# Patient Record
Sex: Female | Born: 1975
Health system: Southern US, Community
[De-identification: ages and names within clinical notes are randomized; demographics above are authoritative.]

## PROBLEM LIST (undated history)

## (undated) DIAGNOSIS — J45909 Unspecified asthma, uncomplicated: Secondary | ICD-10-CM

## (undated) DIAGNOSIS — D219 Benign neoplasm of connective and other soft tissue, unspecified: Secondary | ICD-10-CM

## (undated) DIAGNOSIS — O09529 Supervision of elderly multigravida, unspecified trimester: Secondary | ICD-10-CM

## (undated) DIAGNOSIS — U071 COVID-19: Secondary | ICD-10-CM

## (undated) DIAGNOSIS — D649 Anemia, unspecified: Secondary | ICD-10-CM

## (undated) HISTORY — PX: DILATION AND CURETTAGE OF UTERUS: SHX78

## (undated) HISTORY — DX: Unspecified asthma, uncomplicated: J45.909

## (undated) HISTORY — PX: HERNIA REPAIR: SHX51

---

## 2002-08-16 ENCOUNTER — Other Ambulatory Visit: Admission: RE | Admit: 2002-08-16 | Discharge: 2002-08-16 | Payer: Self-pay | Admitting: Gynecology

## 2003-08-21 ENCOUNTER — Other Ambulatory Visit: Admission: RE | Admit: 2003-08-21 | Discharge: 2003-08-21 | Payer: Self-pay | Admitting: Gynecology

## 2004-08-23 ENCOUNTER — Other Ambulatory Visit: Admission: RE | Admit: 2004-08-23 | Discharge: 2004-08-23 | Payer: Self-pay | Admitting: Gynecology

## 2005-01-24 ENCOUNTER — Ambulatory Visit: Payer: Self-pay | Admitting: Family Medicine

## 2005-03-03 ENCOUNTER — Other Ambulatory Visit: Admission: RE | Admit: 2005-03-03 | Discharge: 2005-03-03 | Payer: Self-pay | Admitting: Gynecology

## 2005-09-15 ENCOUNTER — Inpatient Hospital Stay (HOSPITAL_COMMUNITY): Admission: AD | Admit: 2005-09-15 | Discharge: 2005-09-15 | Payer: Self-pay | Admitting: Gynecology

## 2005-09-21 ENCOUNTER — Inpatient Hospital Stay (HOSPITAL_COMMUNITY): Admission: AD | Admit: 2005-09-21 | Discharge: 2005-09-23 | Payer: Self-pay | Admitting: Gynecology

## 2005-11-02 ENCOUNTER — Other Ambulatory Visit: Admission: RE | Admit: 2005-11-02 | Discharge: 2005-11-02 | Payer: Self-pay | Admitting: Gynecology

## 2007-06-01 ENCOUNTER — Other Ambulatory Visit: Admission: RE | Admit: 2007-06-01 | Discharge: 2007-06-01 | Payer: Self-pay | Admitting: Gynecology

## 2008-03-27 ENCOUNTER — Inpatient Hospital Stay (HOSPITAL_COMMUNITY): Admission: RE | Admit: 2008-03-27 | Discharge: 2008-03-29 | Payer: Self-pay | Admitting: Obstetrics and Gynecology

## 2008-10-29 LAB — CONVERTED CEMR LAB: Pap Smear: NORMAL

## 2009-09-29 ENCOUNTER — Ambulatory Visit: Payer: Self-pay | Admitting: Family Medicine

## 2009-09-30 LAB — CONVERTED CEMR LAB
Albumin: 4.2 g/dL (ref 3.5–5.2)
Alkaline Phosphatase: 49 units/L (ref 39–117)
Calcium: 9.2 mg/dL (ref 8.4–10.5)
GFR calc non Af Amer: 93.51 mL/min (ref >60)
Glucose, Bld: 79 mg/dL (ref 70–99)
HDL: 60.5 mg/dL (ref >39.00)
Sodium: 142 meq/L (ref 135–145)
Triglycerides: 35 mg/dL (ref 0.0–149.0)
VLDL: 7 mg/dL (ref 0.0–40.0)

## 2010-09-30 ENCOUNTER — Ambulatory Visit: Payer: Self-pay | Admitting: Family Medicine

## 2010-10-06 ENCOUNTER — Ambulatory Visit: Payer: Self-pay | Admitting: Family Medicine

## 2010-10-06 DIAGNOSIS — R5383 Other fatigue: Secondary | ICD-10-CM

## 2010-10-06 DIAGNOSIS — R5381 Other malaise: Secondary | ICD-10-CM | POA: Insufficient documentation

## 2010-10-06 LAB — CONVERTED CEMR LAB
Basophils Relative: 0.4 % (ref 0.0–3.0)
Chloride: 106 meq/L (ref 96–112)
Cholesterol: 164 mg/dL (ref 0–200)
Eosinophils Relative: 0.5 % (ref 0.0–5.0)
Folate: 10.3 ng/mL
LDL Cholesterol: 101 mg/dL — ABNORMAL HIGH (ref 0–99)
Lymphocytes Relative: 38.3 % (ref 12.0–46.0)
Neutrophils Relative %: 54 % (ref 43.0–77.0)
Potassium: 3.7 meq/L (ref 3.5–5.1)
RBC: 4.22 M/uL (ref 3.87–5.11)
Sodium: 139 meq/L (ref 135–145)
TSH: 2.44 microintl units/mL (ref 0.35–5.50)
Total CHOL/HDL Ratio: 3
Vitamin B-12: 274 pg/mL (ref 211–911)
WBC: 7.2 10*3/uL (ref 4.5–10.5)

## 2010-11-11 NOTE — Assessment & Plan Note (Signed)
Summary: CPX/CLE   Vital Signs:  Patient profile:   35 year old female Height:      61 inches Weight:      143.75 pounds BMI:     27.26 Temp:     98.1 degrees F oral Pulse rate:   72 / minute Pulse rhythm:   regular BP sitting:   102 / 70  (left arm) Cuff size:   regular  Vitals Entered By: Linde Gillis CMA Duncan Dull) (October 06, 2010 8:05 AM) CC: complete physicial, no pap   History of Present Illness: Very pleasant 35 yo female here for CPX, no pap, has OBGYN.  G2P2- two daughters, ages 61 and 2.  Has Mirena IUD (placed about 1 year ago).  Minimimal periods. No h/o abnormal pap smears or STDs.  Has good relationship with husband. He works in [place], she is a Futures trader. Neither of them have parents so they are a very close knit family.  Very active, has a treadmill at home. Exercises 2-3 times per week.  No issues with CP or SOB with exertion.  Has gained almost 20 pounds since last office visit.  Feels tired.  No hot or cold intolerance.  No signs of depression.  Feels like her acne is worsening.  Has regular derm appt scheduled next month.  Uses Tretinoin Cream 0.025% at bedtime.    Current Medications (verified): 1)  Mirena 20 Mcg/24hr Iud (Levonorgestrel) 2)  Ketoconazole 2 % Sham (Ketoconazole) .... Use Once A Week 3)  Tretinoin 0.025 % Crea (Tretinoin) .... Apply At Bedtime  Allergies (verified): No Known Drug Allergies  Past History:  Past Surgical History: Last updated: 09/29/2009 hernia repair as child  Family History: Last updated: 09/29/2009 Unknown  Social History: Last updated: 09/29/2009 Lives with husband and 2 daughters in [place].  Used to work in Photographer, now Therapist, music. Never Smoked Alcohol use-no Drug use-no Regular exercise-yes  Risk Factors: Exercise: yes (09/29/2009)  Risk Factors: Smoking Status: never (09/29/2009)  Review of Systems      See HPI General:  Complains of fatigue; denies fever. Eyes:  Denies  discharge. ENT:  Denies difficulty swallowing. CV:  Denies chest pain or discomfort. Resp:  Denies shortness of breath. GI:  Denies abdominal pain, change in bowel habits, and constipation. GU:  Denies abnormal vaginal bleeding and discharge. MS:  Denies joint pain, joint redness, and joint swelling. Derm:  Denies rash. Neuro:  Denies weakness. Psych:  Denies anxiety and depression. Endo:  Complains of weight change; denies excessive hunger and heat intolerance. Heme:  Denies abnormal bruising and enlarge lymph nodes.  Physical Exam  General:  Well-developed,well-nourished,in no acute distress; alert,appropriate and cooperative throughout examination Head:  normocephalic and atraumatic.   Eyes:  vision grossly intact, pupils equal, and pupils round.   Ears:  R ear normal and L ear normal.   Nose:  no external deformity.   Mouth:  good dentition.   Neck:  No deformities, masses, or tenderness noted. Lungs:  Normal respiratory effort, chest expands symmetrically. Lungs are clear to auscultation, no crackles or wheezes. Heart:  Normal rate and regular rhythm. S1 and S2 normal without gallop, murmur, click, rub or other extra sounds. Abdomen:  Bowel sounds positive,abdomen soft and non-tender without masses, organomegaly or hernias noted. Msk:  No deformity or scoliosis noted of thoracic or lumbar spine.   Extremities:  No clubbing, cyanosis, edema, or deformity noted with normal full range of motion of all joints.   Skin:  cyctic acne Psych:  Cognition and judgment appear intact. Alert and cooperative with normal attention span and concentration. No apparent delusions, illusions, hallucinations   Impression & Recommendations:  Problem # 1:  Preventive Health Care (ICD-V70.0) Reviewed preventive care protocols, scheduled due services, and updated immunizations Discussed nutrition, exercise, diet, and healthy lifestyle. FLP, BMET today. Orders: Venipuncture (78469)  Problem # 2:   FATIGUE (ICD-780.79) Assessment: New Likely multifactorial.  Will check TSH, CBC, B12, folate. Orders: TLB-TSH (Thyroid Stimulating Hormone) (84443-TSH) TLB-B12 + Folate Pnl (62952_84132-G40/NUU) TLB-CBC Platelet - w/Differential (85025-CBCD)  Complete Medication List: 1)  Mirena 20 Mcg/24hr Iud (Levonorgestrel) 2)  Ketoconazole 2 % Sham (Ketoconazole) .... Use once a week 3)  Tretinoin 0.025 % Crea (Tretinoin) .... Apply at bedtime  Other Orders: TLB-Lipid Panel (80061-LIPID) TLB-BMP (Basic Metabolic Panel-BMET) (80048-METABOL)   Orders Added: 1)  Venipuncture [36415] 2)  TLB-Lipid Panel [80061-LIPID] 3)  TLB-BMP (Basic Metabolic Panel-BMET) [80048-METABOL] 4)  TLB-TSH (Thyroid Stimulating Hormone) [84443-TSH] 5)  TLB-B12 + Folate Pnl [82746_82607-B12/FOL] 6)  TLB-CBC Platelet - w/Differential [85025-CBCD] 7)  Est. Patient 18-39 years [99395] 8)  Est. Patient Level III [72536]    Current Allergies (reviewed today): No known allergies

## 2011-02-22 NOTE — Op Note (Signed)
Brittney Steele, Brittney Steele            ACCOUNT NO.:  [id number]   MEDICAL RECORD NO.:  [id number]          PATIENT TYPE:  INP   LOCATION:  9131                          [facility]ILITY:  [facility]   PHYSICIAN:  Malva Limes, M.D.    DATE OF BIRTH:  [birth year]   DATE OF PROCEDURE:  DATE OF DISCHARGE:                               OPERATIVE REPORT   PREOPERATIVE DIAGNOSES:  1. Intrauterine pregnancy at term.  2. Intrauterine growth rate.  3. History of prior cesarean section.  4. Patient desires repeat cesarean section.   SURGEON:  Malva Limes, M.D.   ASSISTANT:  Luvenia Redden, M.D.   ANESTHESIA:  Spinal with local.   ANTIBIOTICS:  Ancef 1 g.   DRAINS:  Foley bedside drainage.   ESTIMATED BLOOD LOSS:  900 mL.   COMPLICATIONS:  None.   SPECIMENS:  None.   FINDINGS:  The patient had normal fallopian tubes and ovaries  bilaterally.  The patient had multiple subserosal leiomyomata scattered  throughout the uterus mostly on the right fundus.  The patient also had  omental adhesions to the anterior abdominal wall in the midline.   PROCEDURE:  The patient was taken to the operating room where spinal  anesthetic was administered.  She was then placed in dorsal supine  position with left lateral tilt.  The patient was prepped and draped in  the usual fashion for this procedure.  When her anesthetic level was  checked, it was felt to be inadequate; therefore, the patient was  rotated in several maneuvers and given local to the left side, which was  having difficulty with anesthesia.  At this point, Pfannenstiel incision  was made through the previous scar.  Upon entering the abdominal cavity,  it was noted that patient had omental adhesions to the anterior  abdominal wall.  These were taken down with the Bovie.  The bladder flap  was also adherent to the anterior abdominal wall, this was taken down  sharply.  At that point, a low transverse uterine incision was made in  the midline and  extended laterally with blunt dissection.  Amniotic  fluid was noted to be clear.  The infant was delivered in the vertex  presentation.  On delivery, the head, the oropharynx, and nostrils were  bulb suctioned.  Remaining infant was then delivered.  The cord was  doubly clamped and cut, and the infant handed to awaiting NICU team.  Cord blood was then obtained.  The placenta was manually removed.  The  uterus was exteriorized.  The uterine cavity was cleaned with wet lap.  Uterine incision was closed in a single layer of 0 Monocryl in a running  locking fashion.  The bladder flap was now closed.  The parietal  peritoneum and rectus muscles were reapproximated in the midline using 2-  0 Monocryl suture in a running fashion.  The fascia was closed  using 2-0 Monocryl suture in a running fashion.  Subcuticular tissue was  made hemostatic with Bovie.  Stainless steel clips were used to close  the skin.  The patient tolerated the procedure well.  She was taken  to  the recovery room in stable condition.  Instrument and lap counts were  correct x2.           ______________________________  Malva Limes, M.D.     MA/MEDQ  D:  03/27/2008  T:  03/28/2008  Job:  [id number]

## 2011-02-25 NOTE — Op Note (Signed)
Brittney Steele, Brittney Steele            ACCOUNT NO.:  [id number]   MEDICAL RECORD NO.:  [id number]          PATIENT TYPE:  INP   LOCATION:  9121                          [facility]ACILITY:  [facility]   PHYSICIAN:  Ivor Costa. Farrel Gobble, M.D. DATE OF BIRTH:  03/30/1976   DATE OF PROCEDURE:  09/21/2005  DATE OF DISCHARGE:                                 OPERATIVE REPORT   PREOPERATIVE DIAGNOSIS:  Cephalopelvic disproportion.   POSTOPERATIVE DIAGNOSIS:  Cephalopelvic disproportion.   OPERATION/PROCEDURE:  Primary cesarean section, low flap transverse.   SURGEON:  Ivor Costa. Farrel Gobble, M.D.   ANESTHESIA:  Epidural.   IV FLUIDS:  2800 mL of lactated Ringer's.   ESTIMATED BLOOD LOSS:  300 mL.   URINARY OUTPUT:  250 mL of clear urine.   FINDINGS:  A viable female in vertex presentation.  Clear amniotic fluid.  Apgars 9 and 9.  Birth weight 6 pounds and 10 ounces.  Normal uterus, tubes  and ovaries.  Uterus is notable for small fibroids.   COMPLICATIONS:  None.   PATHOLOGY:  None.   DESCRIPTION OF PROCEDURE:  The patient was taken to the operating room,  placed in the supine position with left lateral displacement, prepped and  draped in the usual sterile fashion.  After adequate anesthesia was insured,  a Pfannenstiel skin incision was made with scalpel and carried to the  underlying layer of fascia with electrocautery.  The fascia was scored in  the midline.  Incision was extended laterally with the Bovie.  The inferior  aspect of the fascial incision was grasped with the Kochers.  Underlying  rectus muscles were dissected off with blunt and sharp dissection.  In  similar fashion, the superior aspect of incision was grasped with Kochers  and underlying rectus muscles were separated off.  The rectus muscles were  separated in the midline.  The peritoneum was identified and entered  sharply.  The peritoneal incision was entered superiorly and inferiorly.  Good visualization of the underlying bowel and  bladder.  The bladder blade  was inserted.  The vesicouterine peritoneum was identified, tented up and  incised sharply with the Metzenbaum.  Bladder flap was created digitally.  The bladder blade was then reinserted and the lower uterine segment was  incised in a transverse fashion with the scalpel.  The baby was noted to be  in the OP presentation and delivered from the usual maneuvers.  Cord was cut  and clamped and the infant handed off to the waiting pediatricians.  Cord  bloods were obtained.  The uterus was massaged and placenta allowed to  separate naturally.  The uterus was then cleared of all clots and debris.  The uterine incision was repaired with running locked layer of 0 chromic and  a second suture was used for imbrication.  The pelvis was irrigated with  copious amounts of warm saline.  The adnexa were inspected and noted to be  unremarkable.  The peritoneum, muscle and fascia were also inspected and  treated where appropriate.  The muscles were reapproximated with 3-0 chromic  in the midline.  The fascia was then closed with  0 Vicryl in a running  fashion.  The subcutaneous tissue was irrigated and reapproximated with 3-0  plain.  Skin was closed with 4-0 Vicryl on a Mellody Dance.  Dermabond was placed  afterwards.  The patient tolerated the procedure well.  Sponge, lap and  needle counts were correct x2.  She was given Ancef intraoperatively and  transferred to the PACU in stable condition.      Ivor Costa. Farrel Gobble, M.D.  Electronically Signed     THL/MEDQ  D:  09/21/2005  T:  09/22/2005  Job:  [id number]

## 2011-02-25 NOTE — Discharge Summary (Signed)
Brittney Steele, Brittney Steele            ACCOUNT NO.:  [id number]   MEDICAL RECORD NO.:  [id number]          PATIENT TYPE:  INP   LOCATION:  9131                          [facility]   PHYSICIAN:  Ilda Mori, M.D.   DATE OF BIRTH:  08-24-1976   DATE OF ADMISSION:  03/27/2008  DATE OF DISCHARGE:  03/29/2008                               DISCHARGE SUMMARY   FINAL DIAGNOSES:  1. Intrauterine gestation at term.  2. History of prior cesarean section  3. The patient desires repeat cesarean section.   SURGEON:  Malva Limes, M.D.   ASSISTANT:  Luvenia Redden, MD   COMPLICATIONS:  None.   This 35 year old G2, P 1-0-0-1 presents at term for repeat cesarean  section.  The patient had a prior cesarean section with her last  pregnancy in 2006 secondary to failure to progress and desires a repeat  with this pregnancy as well.  Otherwise, the patient's antepartum course  up to this point has been uncomplicated.  The patient was taken to the  operating room by Dr. Malva Limes on March 27, 2008, where a repeat low-  transverse cesarean section was performed with the delivery of a 5-pound  6-ounce female infant with Apgars of 9 and 9.  Delivery went without  complications.  The patient's postoperative course was benign without  any significant fevers.  The patient was felt ready for discharge to  home on postoperative day #2, was sent home on a regular diet, told to  decrease activities, told to continue her vitamins, was given Darvocet-N  100 #25 one every 4 hours as needed for pain, told she could use  ibuprofen up to 600 mg every 6 hours as needed for pain, and was to  follow up in our office in 4 weeks.   LABS ON DISCHARGE:  The patient had a hemoglobin of 10.0, white blood  cell count of 9.7, and platelets of 205,000.      [name], P.A.-C.      Ilda Mori, M.D.  Electronically Signed    MB/MEDQ  D:  04/30/2008  T:  05/01/2008  Job:  [id number]

## 2011-02-25 NOTE — Discharge Summary (Signed)
NAMEANYA, MURPHEY            ACCOUNT NO.:  [id number]   MEDICAL RECORD NO.:  [id number]          PATIENT TYPE:  INP   LOCATION:  9121                          [facility]   PHYSICIAN:  Timothy P. Fontaine, M.D.DATE OF BIRTH:  July 27, 1976   DATE OF ADMISSION:  09/21/2005  DATE OF DISCHARGE:  09/23/2005                                 DISCHARGE SUMMARY   DISCHARGE DIAGNOSES:  1.  Pregnancy at term  2.  Cephalopelvic disproportion.   PROCEDURE:  Primary low transverse cervical cesarean section, September 21, 2005, Dr. Douglass Rivers.   HOSPITAL COURSE:  This 35 year old, G1 P0, female who enters in labor with  spontaneous rupture of membranes.  The patient progressed to an anterior lip  dilatation at which point she showed no further progression and underwent a  primary cesarean section by Dr. Douglass Rivers for cephalopelvic  disproportion, producing a normal female infant, Apgars 9 and 9, weight 6  pounds 10 ounces.  The patient's postoperative course was uncomplicated.  She was discharged on postoperative day #2 ambulating well, tolerating a  regular diet, with a post operative hemoglobin of 9.2.  The patient's blood  type is B positive.  And her rubella titer was positive.  I reviewed with  the patient, day two discharge is early and options for remaining another 1-  2 days were reviewed but the patient feels well and would prefer discharge.  She is ambulating well, eating a regular diet, and managing her pain with  oral medications.  I reviewed precautions, instructions, and followup.   Gave her a prescription for Tylox, #30, 1-2 p.o. every four to six hours.   She will be seen in the office in six weeks following discharge.      Timothy P. Fontaine, M.D.  Electronically Signed     TPF/MEDQ  D:  09/23/2005  T:  09/24/2005  Job:  [id number]

## 2011-07-07 LAB — CBC
HCT: 30.1 — ABNORMAL LOW
Hemoglobin: 10 — ABNORMAL LOW
Hemoglobin: 11.9 — ABNORMAL LOW
MCHC: 33.1
MCV: 81.2
MCV: 81.9
Platelets: 205
RBC: 4.41
RDW: 14.9
WBC: 10.1
WBC: 9.7

## 2011-09-02 ENCOUNTER — Ambulatory Visit (INDEPENDENT_AMBULATORY_CARE_PROVIDER_SITE_OTHER): Payer: BC Managed Care – PPO | Admitting: Family Medicine

## 2011-09-02 ENCOUNTER — Encounter: Payer: Self-pay | Admitting: Family Medicine

## 2011-09-02 VITALS — BP 100/60 | HR 106 | Temp 98.9°F | Ht 61.5 in | Wt 140.1 lb

## 2011-09-02 DIAGNOSIS — J069 Acute upper respiratory infection, unspecified: Secondary | ICD-10-CM

## 2011-09-02 NOTE — Progress Notes (Signed)
  Patient Name: Brittney Steele Date of Birth: 11-01-1975 Medical Record Number: [id number]  History of Present Illness:  Patent presents with runny nose, sneezing, cough, sore throat, malaise and minimal / low-grade fever .   ? recent exposure to others with similar symptoms.   The patent denies sore throat as the primary complaint. Denies sthortness of breath/wheezing, high fever, chest pain, rhinits for more than 14 days, significant myalgia, otalgia, facial pain, abdominal pain, changes in bowel or bladder.  PMH, PHS, Allergies, Problem List, Medications, Family History, and Social History have all been reviewed.  Review of Systems: as above, eating and drinking - tolerating PO. Urinating normally. No excessive vomitting or diarrhea. O/w as above.  Physical Exam:  Filed Vitals:   09/02/11 1154  BP: 100/60  Pulse: 106  Temp: 98.9 F (37.2 C)  TempSrc: Oral  Height: 5' 1.5" (1.562 m)  Weight: 140 lb 1.9 oz (63.558 kg)  SpO2: 99%    GEN: WDWN, Non-toxic, Atraumatic, normocephalic. A and O x 3. HEENT: Oropharynx clear without exudate, MMM, no significant LAD, mild rhinnorhea Ears: TM clear, COL visualized with good landmarks CV: RRR, no m/g/r. Pulm: CTA B, no wheezes, rhonchi, or crackles, normal respiratory effort. EXT: no c/c/e Psych: well oriented, neither depressed nor anxious in appearance  A/P: 1. URI. Supportive care reviewed with patient. See patient instruction section. Dayquil and Nyquil script given

## 2012-01-05 ENCOUNTER — Telehealth: Payer: Self-pay

## 2012-01-05 NOTE — Telephone Encounter (Signed)
Pt said for 1 month  had different feeling or tightness in chest on and off after running on treadmill (pt runs 2-3 miles on treadmill daily. Pt has not run on treadmill today but was running down stairs and had tightness in middle of chest and pt is concerned it may be her heart. Pt has no chest pain, SOB or N&V. Pt not sure if family hx of heart problems. No available appts today and Dr Dayton Martes said if pt concerned should go to UC. Pt said OK and will go to UC across from [facility].

## 2012-02-08 DIAGNOSIS — L219 Seborrheic dermatitis, unspecified: Secondary | ICD-10-CM | POA: Insufficient documentation

## 2012-02-08 DIAGNOSIS — L709 Acne, unspecified: Secondary | ICD-10-CM | POA: Insufficient documentation

## 2012-09-19 DIAGNOSIS — Z1321 Encounter for screening for nutritional disorder: Secondary | ICD-10-CM | POA: Insufficient documentation

## 2012-09-19 DIAGNOSIS — Z0000 Encounter for general adult medical examination without abnormal findings: Secondary | ICD-10-CM | POA: Insufficient documentation

## 2012-09-19 DIAGNOSIS — Z79899 Other long term (current) drug therapy: Secondary | ICD-10-CM | POA: Insufficient documentation

## 2013-05-03 ENCOUNTER — Inpatient Hospital Stay (HOSPITAL_COMMUNITY): Payer: BC Managed Care – PPO

## 2013-05-03 ENCOUNTER — Inpatient Hospital Stay (HOSPITAL_COMMUNITY)
Admission: AD | Admit: 2013-05-03 | Discharge: 2013-05-04 | Disposition: A | Discharge status: Home / Self Care | Payer: BC Managed Care – PPO | Source: Ambulatory Visit | Attending: Obstetrics and Gynecology | Admitting: Obstetrics and Gynecology

## 2013-05-03 ENCOUNTER — Encounter (HOSPITAL_COMMUNITY): Payer: Self-pay | Admitting: *Deleted

## 2013-05-03 DIAGNOSIS — O021 Missed abortion: Secondary | ICD-10-CM | POA: Insufficient documentation

## 2013-05-03 LAB — URINALYSIS, ROUTINE W REFLEX MICROSCOPIC
Leukocytes, UA: NEGATIVE
Nitrite: NEGATIVE
Protein, ur: NEGATIVE mg/dL
Specific Gravity, Urine: >1.03 — ABNORMAL HIGH (ref 1.005–1.030)
Urobilinogen, UA: 0.2 mg/dL (ref 0.0–1.0)

## 2013-05-03 LAB — URINE MICROSCOPIC-ADD ON

## 2013-05-03 LAB — CBC
Hemoglobin: 11.1 g/dL — ABNORMAL LOW (ref 12.0–15.0)
MCH: 27.8 pg (ref 26.0–34.0)
MCHC: 32.9 g/dL (ref 30.0–36.0)
Platelets: 221 10*3/uL (ref 150–400)
RDW: 13.7 % (ref 11.5–15.5)

## 2013-05-03 NOTE — MAU Note (Signed)
Tuesday started with brown spotting which has continued all week. Tonight having bright red spotting and cramps.

## 2013-05-03 NOTE — MAU Provider Note (Signed)
History     CSN: [id number]  Arrival date and time: 05/03/13 2212   First Provider Initiated Contact with Patient 05/03/13 2331      Chief Complaint  Patient presents with  . Vaginal Bleeding  . Abdominal Cramping   Vaginal Bleeding  Abdominal Cramping    Brittney Steele is a 37 y.o. [id number] at [redacted]w[redacted]d who presents today with vaginal bleeding. She states that it started on Tuesday as brown spotting and has gotten worse since then. She states it is like a period at this time. She denies any pain. She was seen in the office last week and had an ultrasound done at that time, and was told that everything was fine.  History reviewed. No pertinent past medical history.  Past Surgical History  Procedure Laterality Date  . Hernia repair    . Cesarean section      History reviewed. No pertinent family history.  History  Substance Use Topics  . Smoking status: Never Smoker   . Smokeless tobacco: Not on file  . Alcohol Use: No    Allergies: No Known Allergies  Prescriptions prior to admission  Medication Sig Dispense Refill  . ketoconazole (NIZORAL) 2 % shampoo Apply 1 application topically 2 (two) times a week.        Marland Kitchen levonorgestrel (MIRENA) 20 MCG/24HR IUD 1 each by Intrauterine route once.        . tretinoin (RETIN-A) 0.025 % cream Apply 1 application topically at bedtime.          Review of Systems  Genitourinary: Positive for vaginal bleeding.   Physical Exam   Blood pressure 110/76, pulse 75, temperature 98 F (36.7 C), resp. rate 20, height 5' 1.5" (1.562 m), weight 66.679 kg (147 lb), SpO2 100.00%.  Physical Exam  Nursing note and vitals reviewed. Constitutional: She is oriented to person, place, and time. She appears well-developed and well-nourished. No distress.  Cardiovascular: Normal rate.   Respiratory: Effort normal.  GI: Soft. There is no tenderness.  Genitourinary:   External: no lesion Vagina: small amount of blood seen Cervix: pink, smooth,  no CMT Uterus: slightly enlarged  Adnexa: NT   Neurological: She is alert and oriented to person, place, and time.  Skin: Skin is warm and dry.  Psychiatric: She has a normal mood and affect.    MAU Course  Procedures  Results for orders placed during the hospital encounter of 05/03/13 (from the past 24 hour(s))  URINALYSIS, ROUTINE W REFLEX MICROSCOPIC     Status: Abnormal   Collection Time    05/03/13 10:40 PM      Result Value Range   Color, Urine YELLOW  YELLOW   APPearance CLEAR  CLEAR   Specific Gravity, Urine >1.030 (*) 1.005 - 1.030   pH 6.0  5.0 - 8.0   Glucose, UA NEGATIVE  NEGATIVE mg/dL   Hgb urine dipstick LARGE (*) NEGATIVE   Bilirubin Urine NEGATIVE  NEGATIVE   Ketones, ur NEGATIVE  NEGATIVE mg/dL   Protein, ur NEGATIVE  NEGATIVE mg/dL   Urobilinogen, UA 0.2  0.0 - 1.0 mg/dL   Nitrite NEGATIVE  NEGATIVE   Leukocytes, UA NEGATIVE  NEGATIVE  URINE MICROSCOPIC-ADD ON     Status: Abnormal   Collection Time    05/03/13 10:40 PM      Result Value Range   Squamous Epithelial / LPF FEW (*) RARE   WBC, UA 0-2  <3 WBC/hpf   RBC / HPF 21-50  <3 RBC/hpf  Bacteria, UA FEW (*) RARE  WET PREP, GENITAL     Status: Abnormal   Collection Time    05/03/13 11:35 PM      Result Value Range   Yeast Wet Prep HPF POC NONE SEEN  NONE SEEN   Trich, Wet Prep NONE SEEN  NONE SEEN   Clue Cells Wet Prep HPF POC NONE SEEN  NONE SEEN   WBC, Wet Prep HPF POC FEW (*) NONE SEEN  CBC     Status: Abnormal   Collection Time    05/03/13 11:39 PM      Result Value Range   WBC 10.1  4.0 - 10.5 K/uL   RBC 3.99  3.87 - 5.11 MIL/uL   Hemoglobin 11.1 (*) 12.0 - 15.0 g/dL   HCT 57.8 (*) 46.9 - 62.9 %   MCV 84.5  78.0 - 100.0 fL   MCH 27.8  26.0 - 34.0 pg   MCHC 32.9  30.0 - 36.0 g/dL   RDW 52.8  41.3 - 24.4 %   Platelets 221  150 - 400 K/uL  ABO/RH     Status: None   Collection Time    05/03/13 11:39 PM      Result Value Range   ABO/RH(D) B POS    HCG, QUANTITATIVE, PREGNANCY      Status: Abnormal   Collection Time    05/03/13 11:39 PM      Result Value Range   hCG, Beta Chain, Quant, S 4559 (*) <5 mIU/mL   US Ob Comp Less 14 Wks  05/04/2013   *RADIOLOGY REPORT*  Clinical Data: Pregnant, abdominal pain, bleeding  OBSTETRIC <14 WK ULTRASOUND  Technique:  Transabdominal ultrasound was performed for evaluation of the gestation as well as the maternal uterus and adnexal regions.  Comparison:  None.  Intrauterine gestational sac: Visualized/normal in shape. Yolk sac: Not visualized Embryo: Present Cardiac Activity: Absent  CRL:  27.4 mm  9 w  4 d       [place] EDC: 12/03/2013  Maternal uterus/Adnexae: No subchronic hemorrhage.  Left ovary is within normal limits, measuring 2.8 x 1.8 x 1.9 cm.  Right ovary is within normal limits, measuring 2.8 x 1.6 x 1.8 cm.  No free fluid.  IMPRESSION: Intrauterine gestation without cardiac activity.  Findings meet definitive criteria for failed pregnancy.  This recommendation follows SRU consensus guidelines: Diagnostic Criteria for Nonviable Pregnancy Early in the First Trimester.  Malva Limes Med 2013; 010:2725-36.  These results were called by telephone on 05/04/2013 at 0020 hrs to Thressa Sheller, who verbally acknowledged these results.   Original Report Authenticated By: Charline Bills, M.D.   Iovian.Goodie: Reviewed [place] and labs with Dr. Claiborne Billings. Offer patient cytotec, wait and see or D&E that could possibly be done tomorrow. Will call back with patient's choice.  0111: Patient would like to have a D&E done. Discussed with Dr. Claiborne Billings. Will call back with a time.  0122: Scheduled for D&E at 0900  Assessment and Plan   1. Missed abortion    NPO Surgery at 0900 tomorrow Return to the hospital at 0800  Tawnya Crook 05/03/2013, 11:44 PM

## 2013-05-03 NOTE — MAU Note (Signed)
Patient is in with c/o heavy vaginal bleeding that started this evening. The pad that she placed about an hour and half ago is moderated saturated. New pad given. She denies pain at this time.

## 2013-05-04 ENCOUNTER — Encounter (HOSPITAL_COMMUNITY): Payer: Self-pay

## 2013-05-04 ENCOUNTER — Encounter (HOSPITAL_COMMUNITY)
Admission: AD | Disposition: A | Discharge status: Home / Self Care | Payer: Self-pay | Source: Ambulatory Visit | Attending: Obstetrics and Gynecology

## 2013-05-04 ENCOUNTER — Inpatient Hospital Stay (HOSPITAL_COMMUNITY): Payer: BC Managed Care – PPO

## 2013-05-04 ENCOUNTER — Ambulatory Visit (HOSPITAL_COMMUNITY)
Admission: AD | Admit: 2013-05-04 | Discharge: 2013-05-04 | Disposition: A | Discharge status: Home / Self Care | Payer: BC Managed Care – PPO | Source: Ambulatory Visit | Attending: Obstetrics and Gynecology | Admitting: Obstetrics and Gynecology

## 2013-05-04 DIAGNOSIS — O021 Missed abortion: Secondary | ICD-10-CM

## 2013-05-04 HISTORY — PX: DILATION AND EVACUATION: SHX1459

## 2013-05-04 LAB — WET PREP, GENITAL: Clue Cells Wet Prep HPF POC: NONE SEEN

## 2013-05-04 LAB — ABO/RH: ABO/RH(D): B POS

## 2013-05-04 SURGERY — DILATION AND EVACUATION, UTERUS
Anesthesia: Monitor Anesthesia Care

## 2013-05-04 MED ORDER — SODIUM CHLORIDE 0.9 % IR SOLN
Status: DC | PRN
  Administered 2013-05-04: 1000 mL

## 2013-05-04 MED ORDER — DEXAMETHASONE SODIUM PHOSPHATE 10 MG/ML IJ SOLN
INTRAMUSCULAR | Status: AC
  Filled 2013-05-04: qty 1

## 2013-05-04 MED ORDER — LIDOCAINE HCL (CARDIAC) 20 MG/ML IV SOLN
INTRAVENOUS | Status: AC
  Filled 2013-05-04: qty 5

## 2013-05-04 MED ORDER — ONDANSETRON HCL 4 MG/2ML IJ SOLN
INTRAMUSCULAR | Status: DC | PRN
  Administered 2013-05-04: 4 mg via INTRAVENOUS

## 2013-05-04 MED ORDER — KETOROLAC TROMETHAMINE 30 MG/ML IJ SOLN
INTRAMUSCULAR | Status: DC | PRN
  Administered 2013-05-04: 30 mg via INTRAVENOUS

## 2013-05-04 MED ORDER — FENTANYL CITRATE 0.05 MG/ML IJ SOLN
INTRAMUSCULAR | Status: DC | PRN
  Administered 2013-05-04 (×2): 50 ug via INTRAVENOUS

## 2013-05-04 MED ORDER — MIDAZOLAM HCL 2 MG/2ML IJ SOLN
INTRAMUSCULAR | Status: AC
  Filled 2013-05-04: qty 2

## 2013-05-04 MED ORDER — PROPOFOL 10 MG/ML IV EMUL
INTRAVENOUS | Status: DC | PRN
  Administered 2013-05-04: 25 mg via INTRAVENOUS
  Administered 2013-05-04 (×4): 50 mg via INTRAVENOUS

## 2013-05-04 MED ORDER — LIDOCAINE HCL 1 % IJ SOLN
INTRAMUSCULAR | Status: DC | PRN
  Administered 2013-05-04: 10 mL

## 2013-05-04 MED ORDER — FENTANYL CITRATE 0.05 MG/ML IJ SOLN: 25.0000 ug | INTRAMUSCULAR | Status: DC | PRN

## 2013-05-04 MED ORDER — KETOROLAC TROMETHAMINE 30 MG/ML IJ SOLN
INTRAMUSCULAR | Status: AC
  Filled 2013-05-04: qty 1

## 2013-05-04 MED ORDER — ONDANSETRON HCL 4 MG/2ML IJ SOLN
INTRAMUSCULAR | Status: AC
  Filled 2013-05-04: qty 2

## 2013-05-04 MED ORDER — LACTATED RINGERS IV SOLN
INTRAVENOUS | Status: DC | PRN
  Administered 2013-05-04: 10:00:00 via INTRAVENOUS

## 2013-05-04 MED ORDER — MIDAZOLAM HCL 5 MG/5ML IJ SOLN
INTRAMUSCULAR | Status: DC | PRN
  Administered 2013-05-04: 2 mg via INTRAVENOUS

## 2013-05-04 MED ORDER — DEXAMETHASONE SODIUM PHOSPHATE 10 MG/ML IJ SOLN
INTRAMUSCULAR | Status: DC | PRN
  Administered 2013-05-04: 10 mg via INTRAVENOUS

## 2013-05-04 MED ORDER — PROPOFOL 10 MG/ML IV EMUL
INTRAVENOUS | Status: AC
  Filled 2013-05-04: qty 20

## 2013-05-04 MED ORDER — DEXTROSE 5 % IV SOLN
100.0000 mg | Freq: Once | INTRAVENOUS | Status: AC
  Administered 2013-05-04: 100 mg via INTRAVENOUS
  Filled 2013-05-04: qty 100

## 2013-05-04 MED ORDER — FENTANYL CITRATE 0.05 MG/ML IJ SOLN
INTRAMUSCULAR | Status: AC
  Filled 2013-05-04: qty 2

## 2013-05-04 SURGICAL SUPPLY — 23 items
CATH ROBINSON RED A/P 16FR (CATHETERS) ×2 IMPLANT
CLOTH BEACON ORANGE TIMEOUT ST (SAFETY) ×2 IMPLANT
DECANTER SPIKE VIAL GLASS SM (MISCELLANEOUS) ×2 IMPLANT
GLOVE BIO SURGEON STRL SZ 6.5 (GLOVE) ×2 IMPLANT
GLOVE BIOGEL PI IND STRL 6.5 (GLOVE) ×1 IMPLANT
GLOVE BIOGEL PI IND STRL 7.0 (GLOVE) IMPLANT
GLOVE BIOGEL PI INDICATOR 6.5 (GLOVE) ×3
GLOVE BIOGEL PI INDICATOR 7.0 (GLOVE) ×1
GOWN STRL REIN XL XLG (GOWN DISPOSABLE) ×4 IMPLANT
KIT BERKELEY 1ST TRIMESTER 3/8 (MISCELLANEOUS) ×2 IMPLANT
NDL SPNL 22GX3.5 QUINCKE BK (NEEDLE) ×1 IMPLANT
NEEDLE SPNL 22GX3.5 QUINCKE BK (NEEDLE) ×2 IMPLANT
NS IRRIG 1000ML POUR BTL (IV SOLUTION) ×2 IMPLANT
PACK VAGINAL MINOR WOMEN LF (CUSTOM PROCEDURE TRAY) ×2 IMPLANT
PAD OB MATERNITY 4.3X12.25 (PERSONAL CARE ITEMS) ×2 IMPLANT
PAD PREP 24X48 CUFFED NSTRL (MISCELLANEOUS) ×2 IMPLANT
SET BERKELEY SUCTION TUBING (SUCTIONS) ×2 IMPLANT
SYR CONTROL 10ML LL (SYRINGE) ×2 IMPLANT
TOWEL OR 17X24 6PK STRL BLUE (TOWEL DISPOSABLE) ×4 IMPLANT
VACURETTE 10 RIGID CVD (CANNULA) ×1 IMPLANT
VACURETTE 7MM CVD STRL WRAP (CANNULA) IMPLANT
VACURETTE 8 RIGID CVD (CANNULA) IMPLANT
VACURETTE 9 RIGID CVD (CANNULA) IMPLANT

## 2013-05-04 NOTE — Anesthesia Preprocedure Evaluation (Signed)
Anesthesia Evaluation  Patient identified by MRN, date of birth, ID band Patient awake    Reviewed: Allergy & Precautions, H&P , Patient's Chart, lab work & pertinent test results, reviewed documented beta blocker date and time   Airway Mallampati: II TM Distance: >3 FB Neck ROM: full    Dental no notable dental hx.    Pulmonary  breath sounds clear to auscultation  Pulmonary exam normal       Cardiovascular Rhythm:regular Rate:Normal     Neuro/Psych    GI/Hepatic   Endo/Other    Renal/GU      Musculoskeletal   Abdominal   Peds  Hematology   Anesthesia Other Findings   Reproductive/Obstetrics                           Anesthesia Physical Anesthesia Plan  ASA: II  Anesthesia Plan: MAC   Post-op Pain Management:    Induction: Intravenous  Airway Management Planned: LMA, Mask and Natural Airway  Additional Equipment:   Intra-op Plan:   Post-operative Plan:   Informed Consent: I have reviewed the patients History and Physical, chart, labs and discussed the procedure including the risks, benefits and alternatives for the proposed anesthesia with the patient or authorized representative who has indicated his/her understanding and acceptance.   Dental Advisory Given  Plan Discussed with: CRNA and Surgeon  Anesthesia Plan Comments:         Anesthesia Quick Evaluation  

## 2013-05-04 NOTE — Transfer of Care (Signed)
Immediate Anesthesia Transfer of Care Note  Patient: Brittney Steele  Procedure(s) Performed: Procedure(s): DILATATION AND EVACUATION (N/A)  Patient Location: PACU  Anesthesia Type:MAC  Level of Consciousness: awake, alert  and oriented  Airway & Oxygen Therapy: Patient Spontanous Breathing and Patient connected to nasal cannula oxygen  Post-op Assessment: Report given to PACU RN and Post -op Vital signs reviewed and stable  Post vital signs: Reviewed and stable  Complications: No apparent anesthesia complications

## 2013-05-04 NOTE — Brief Op Note (Signed)
05/04/2013  10:19 AM  PATIENT:  Brittney Steele  37 y.o. female  PRE-OPERATIVE DIAGNOSIS:  Missed Abortion  POST-OPERATIVE DIAGNOSIS:  missed abortion  PROCEDURE:  Procedure(s): DILATATION AND EVACUATION (N/A)  SURGEON:  Surgeon(s) and Role:    Philip Aspen, DO - Primary   ANESTHESIA:   10cc lidocaine pericervical, IV sedation  EBL:  Total I/O In: 500 [I.V.:500] Out: 60 [Urine:50; Blood:10]   SPECIMEN:  Source of Specimen:  POC  DISPOSITION OF SPECIMEN:  PATHOLOGY   PLAN OF CARE: Discharge to home after PACU  PATIENT DISPOSITION:  PACU - hemodynamically stable.

## 2013-05-04 NOTE — H&P (Signed)
37 y.o. complains of vaginal bleeding that started as brown spotting, now red blood like a period.  No other complaints.  History reviewed. No pertinent past medical history. Past Surgical History  Procedure Laterality Date  . Hernia repair    . Cesarean section      History   Social History  . Marital Status: Married    Spouse Name: N/A    Number of Children: 2  . Years of Education: N/A   Occupational History  .  [employer]   Social History Main Topics  . Smoking status: Never Smoker   . Smokeless tobacco: Not on file  . Alcohol Use: No  . Drug Use: No  . Sexually Active: No   Other Topics Concern  . Not on file   Social History Narrative   Lives with husband and 2 daughters in [place]..Used to work in Photographer, now Therapist, music      Regular exercise-yes    No current facility-administered medications on file prior to encounter.   Current Outpatient Prescriptions on File Prior to Encounter  Medication Sig Dispense Refill  . ketoconazole (NIZORAL) 2 % shampoo Apply 1 application topically 2 (two) times a week.        Marland Kitchen levonorgestrel (MIRENA) 20 MCG/24HR IUD 1 each by Intrauterine route once.        . tretinoin (RETIN-A) 0.025 % cream Apply 1 application topically at bedtime.          No Known Allergies  @VITALS2 @  Lungs: clear to ascultation Cor:  RRR Abdomen:  soft, nontender, nondistended. Ex:  no cords, erythema Pelvic:  Def to OR  A:  MAB at [redacted]w[redacted]d   P:  Discussed R/B/alternatives for treatment with patient.  She would like to proceed with D&E.  Pt is stable with no heavy bleeding.  Will have patient return in am, NPO for D&E. CBC, T&S already obtained.Philip Aspen

## 2013-05-04 NOTE — MAU Provider Note (Signed)
agree

## 2013-05-04 NOTE — Anesthesia Postprocedure Evaluation (Signed)
  Anesthesia Post-op Note  Patient: Brittney Steele  Procedure(s) Performed: Procedure(s): DILATATION AND EVACUATION (N/A) Patient is awake and responsive. Pain and nausea are reasonably well controlled. Vital signs are stable and clinically acceptable. Oxygen saturation is clinically acceptable. There are no apparent anesthetic complications at this time. Patient is ready for discharge.

## 2013-05-04 NOTE — Discharge Summary (Signed)
  Pt presented for outpatient D&E for 9+ week MAB.  Se op report for details.  No complications.  Discharged with instructions to take doxycycline 12hrs post op x 1 dose and use motrin for pain.  She will f/u with me in the office in 2 weeks .

## 2013-05-05 LAB — GC/CHLAMYDIA PROBE AMP: GC Probe RNA: NEGATIVE

## 2013-05-06 ENCOUNTER — Encounter (HOSPITAL_COMMUNITY): Payer: Self-pay | Admitting: Obstetrics and Gynecology

## 2013-05-06 NOTE — Op Note (Signed)
NAMEARONDA, Brittney Steele            ACCOUNT NO.:  [id number]  MEDICAL RECORD NO.:  [id number]  LOCATION:  [facility]                          [facility]ACILITY:  [facility]  PHYSICIAN:  Philip Aspen, DO    DATE OF BIRTH:  03/28/1976  DATE OF PROCEDURE: DATE OF DISCHARGE:  05/04/2013                              OPERATIVE REPORT   __________.  Second pass was performed with minimal return followed by gentle curettage of all 4 quadrants.  It also showed minimal return and good uterine cry.  Final pass with suction curette was performed and all instruments were removed from the uterus, cervix, and vagina.  Tenaculum sites were found to be hemostatic.  The patient was cleaned, dried, returned to dorsal supine position.  The patient tolerated the procedure well.  Sponge, lap, and needle counts were correct x2.  The patient was taken to recovery in stable condition.  Of note, patient did receive 100 mg of doxycycline IV prior to the procedure and a prescription for 100 mg of oral doxycycline to be taken 12 hours after.          ______________________________ Philip Aspen, DO     [place]/[facility]  D:  05/05/2013  T:  05/05/2013  Job:  [id number]

## 2013-08-08 ENCOUNTER — Encounter: Payer: BC Managed Care – PPO | Admitting: Adult Health

## 2013-08-15 ENCOUNTER — Other Ambulatory Visit: Payer: Self-pay

## 2013-08-27 ENCOUNTER — Telehealth: Payer: Self-pay | Admitting: Family Medicine

## 2013-08-27 ENCOUNTER — Other Ambulatory Visit: Payer: Self-pay | Admitting: Internal Medicine

## 2013-08-27 DIAGNOSIS — Z0000 Encounter for general adult medical examination without abnormal findings: Secondary | ICD-10-CM

## 2013-08-27 NOTE — Telephone Encounter (Signed)
Pt is coming in 08/28/2013 for labs for CPE.  Could you please add CPE lab orders? Thank you.

## 2013-08-27 NOTE — Telephone Encounter (Signed)
Future labs ordered.  

## 2013-08-28 ENCOUNTER — Other Ambulatory Visit (INDEPENDENT_AMBULATORY_CARE_PROVIDER_SITE_OTHER): Payer: BC Managed Care – PPO

## 2013-08-28 ENCOUNTER — Ambulatory Visit (INDEPENDENT_AMBULATORY_CARE_PROVIDER_SITE_OTHER): Payer: BC Managed Care – PPO

## 2013-08-28 DIAGNOSIS — R5381 Other malaise: Secondary | ICD-10-CM

## 2013-08-28 DIAGNOSIS — Z0000 Encounter for general adult medical examination without abnormal findings: Secondary | ICD-10-CM

## 2013-08-28 DIAGNOSIS — Z23 Encounter for immunization: Secondary | ICD-10-CM

## 2013-08-28 LAB — TSH: TSH: 0.89 u[IU]/mL (ref 0.35–5.50)

## 2013-08-28 LAB — CBC
HCT: 35.4 % — ABNORMAL LOW (ref 36.0–46.0)
Hemoglobin: 11.7 g/dL — ABNORMAL LOW (ref 12.0–15.0)
MCHC: 33 g/dL (ref 30.0–36.0)
MCV: 83.9 fl (ref 78.0–100.0)
RDW: 12.1 % (ref 11.5–14.6)

## 2013-08-28 LAB — COMPREHENSIVE METABOLIC PANEL
Albumin: 3.9 g/dL (ref 3.5–5.2)
Alkaline Phosphatase: 40 U/L (ref 39–117)
CO2: 26 mEq/L (ref 19–32)
Calcium: 9.1 mg/dL (ref 8.4–10.5)
Chloride: 107 mEq/L (ref 96–112)
Glucose, Bld: 90 mg/dL (ref 70–99)
Potassium: 3.8 mEq/L (ref 3.5–5.1)
Sodium: 139 mEq/L (ref 135–145)
Total Protein: 6.8 g/dL (ref 6.0–8.3)

## 2013-09-17 ENCOUNTER — Encounter: Payer: BC Managed Care – PPO | Admitting: Internal Medicine

## 2013-09-23 ENCOUNTER — Encounter: Payer: Self-pay | Admitting: Internal Medicine

## 2013-09-23 ENCOUNTER — Ambulatory Visit (INDEPENDENT_AMBULATORY_CARE_PROVIDER_SITE_OTHER): Payer: BC Managed Care – PPO | Admitting: Internal Medicine

## 2013-09-23 VITALS — BP 104/62 | HR 68 | Temp 98.2°F | Ht 61.0 in | Wt 145.5 lb

## 2013-09-23 DIAGNOSIS — Z111 Encounter for screening for respiratory tuberculosis: Secondary | ICD-10-CM

## 2013-09-23 DIAGNOSIS — Z09 Encounter for follow-up examination after completed treatment for conditions other than malignant neoplasm: Secondary | ICD-10-CM

## 2013-09-23 DIAGNOSIS — Z0000 Encounter for general adult medical examination without abnormal findings: Secondary | ICD-10-CM

## 2013-09-23 DIAGNOSIS — Z23 Encounter for immunization: Secondary | ICD-10-CM

## 2013-09-23 NOTE — Addendum Note (Signed)
Addended by: Desmond Dike on: 09/23/2013 08:45 AM   Modules accepted: Orders

## 2013-09-23 NOTE — Progress Notes (Signed)
Pre-visit discussion using our clinic review tool. No additional management support is needed unless otherwise documented below in the visit note.  

## 2013-09-23 NOTE — Patient Instructions (Signed)

## 2013-09-23 NOTE — Progress Notes (Addendum)
Subjective:    Patient ID: Brittney Steele, female    DOB: 1976-03-03, 37 y.o.   MRN: [id number]  HPI  Pt presents to the clinic today for her annual exam. 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  Flu: 08/28/2013 Tetanus: more than 5 years LMP: 08/16/2013 Pap Smear: 01/2013 (recent miscarriage 04/2013) Dentist: biannually  Review of Systems      History reviewed. No pertinent past medical history.  Current Outpatient Prescriptions  Medication Sig Dispense Refill  . ketoconazole (NIZORAL) 2 % shampoo Apply 1 application topically 2 (two) times a week.         No current facility-administered medications for this visit.    No Known Allergies  History reviewed. No pertinent family history.  History   Social History  . Marital Status: Married    Spouse Name: N/A    Number of Children: 2  . Years of Education: N/A   Occupational History  .  [employer]   Social History Main Topics  . Smoking status: Never Smoker   . Smokeless tobacco: Not on file  . Alcohol Use: No  . Drug Use: No  . Sexual Activity: No   Other Topics Concern  . Not on file   Social History Narrative   Lives with husband and 2 daughters in [place]..Used to work in Photographer, now Therapist, music      Regular exercise-yes     Constitutional: Denies fever, malaise, fatigue, headache or abrupt weight changes.  HEENT: Denies eye pain, eye redness, ear pain, ringing in the ears, wax buildup, runny nose, nasal congestion, bloody nose, or sore throat. Respiratory: Denies difficulty breathing, shortness of breath, cough or sputum production.   Cardiovascular: Denies chest pain, chest tightness, palpitations or swelling in the hands or feet.  Gastrointestinal: Denies abdominal pain, bloating, constipation, diarrhea or blood in the stool.  GU: Denies urgency, frequency, pain with urination, burning sensation, blood in urine, odor or discharge. Musculoskeletal: Denies decrease in range of  motion, difficulty with gait, muscle pain or joint pain and swelling.  Skin: Denies redness, rashes, lesions or ulcercations.  Neurological: Denies dizziness, difficulty with memory, difficulty with speech or problems with balance and coordination.   No other specific complaints in a complete review of systems (except as listed in HPI above).  Objective:   Physical Exam    BP 104/62  Pulse 68  Temp(Src) 98.2 F (36.8 C) (Oral)  Ht 5\' 1"  (1.549 m)  Wt 145 lb 8 oz (65.998 kg)  BMI 27.51 kg/m2  SpO2 99%  LMP 09/15/2013 Wt Readings from Last 3 Encounters:  09/23/13 145 lb 8 oz (65.998 kg)  05/03/13 147 lb (66.679 kg)  09/02/11 140 lb 1.9 oz (63.558 kg)    General: Appears her stated age, well developed, well nourished in NAD. Skin: Warm, dry and intact. No rashes, lesions or ulcerations noted. HEENT: Head: normal shape and size; Eyes: sclera white, no icterus, conjunctiva pink, PERRLA and EOMs intact; Ears: Tm's gray and intact, normal light reflex; Nose: mucosa pink and moist, septum midline; Throat/Mouth: Teeth present, mucosa pink and moist, no exudate, lesions or ulcerations noted.  Neck: Normal range of motion. Neck supple, trachea midline. No massses, lumps or thyromegaly present.  Cardiovascular: Normal rate and rhythm. S1,S2 noted.  No murmur, rubs or gallops noted. No JVD or BLE edema. No carotid bruits noted. Pulmonary/Chest: Normal effort and positive vesicular breath sounds. No respiratory distress. No wheezes, rales or ronchi noted.  Abdomen: Soft and nontender. Normal bowel sounds, no bruits noted. No distention or masses noted. Liver, spleen and kidneys non palpable. Musculoskeletal: Normal range of motion. No signs of joint swelling. No difficulty with gait.  Neurological: Alert and oriented. Cranial nerves II-XII intact. Coordination normal. +DTRs bilaterally. Psychiatric: Mood and affect normal. Behavior is normal. Judgment and thought content normal.    EKG:  BMET    Component Value Date/Time   NA 139 08/28/2013 0835   K 3.8 08/28/2013 0835   CL 107 08/28/2013 0835   CO2 26 08/28/2013 0835   GLUCOSE 90 08/28/2013 0835   BUN 10 08/28/2013 0835   CREATININE 0.8 08/28/2013 0835   CALCIUM 9.1 08/28/2013 0835   GFRNONAA 126.25 10/06/2010 0819    Lipid Panel     Component Value Date/Time   CHOL 171 08/28/2013 0835   TRIG 60.0 08/28/2013 0835   HDL 61.50 08/28/2013 0835   CHOLHDL 3 08/28/2013 0835   VLDL 12.0 08/28/2013 0835   LDLCALC 98 08/28/2013 0835    CBC    Component Value Date/Time   WBC 4.7 08/28/2013 0835   RBC 4.22 08/28/2013 0835   HGB 11.7* 08/28/2013 0835   HCT 35.4* 08/28/2013 0835   PLT 201.0 08/28/2013 0835   MCV 83.9 08/28/2013 0835   MCH 27.8 05/03/2013 2339   MCHC 33.0 08/28/2013 0835   RDW 12.1 08/28/2013 0835   LYMPHSABS 2.8 10/06/2010 0819   MONOABS 0.5 10/06/2010 0819   EOSABS 0.0 10/06/2010 0819   BASOSABS 0.0 10/06/2010 0819    Hgb A1C No results found for this basename: HGBA1C       Assessment & Plan:   Preventative Health Maintenance:  Will get Tdap today Form filled out for substitute teaching Labs reviewed- mild anemia otherwise improving She will need TB test today for form to be filled out  RTC in 1 year or sooner if needed

## 2013-09-26 NOTE — Addendum Note (Signed)
Addended by: Baldomero Lamy on: 09/26/2013 08:02 AM   Modules accepted: Orders

## 2014-04-28 ENCOUNTER — Other Ambulatory Visit: Payer: Self-pay | Admitting: Obstetrics and Gynecology

## 2014-04-29 LAB — CYTOLOGY - PAP

## 2014-07-25 ENCOUNTER — Other Ambulatory Visit: Payer: Self-pay

## 2014-07-25 ENCOUNTER — Ambulatory Visit: Payer: BC Managed Care – PPO

## 2014-08-11 ENCOUNTER — Encounter: Payer: Self-pay | Admitting: Internal Medicine

## 2014-09-18 ENCOUNTER — Ambulatory Visit (INDEPENDENT_AMBULATORY_CARE_PROVIDER_SITE_OTHER): Payer: BC Managed Care – PPO | Admitting: Physician Assistant

## 2014-09-18 VITALS — BP 108/76 | HR 70 | Temp 98.0°F | Resp 16 | Ht 61.0 in | Wt 131.6 lb

## 2014-09-18 DIAGNOSIS — Z23 Encounter for immunization: Secondary | ICD-10-CM

## 2014-09-18 NOTE — Progress Notes (Signed)
   Subjective:    Patient ID: Brittney Steele, female    DOB: May 04, 1976, 38 y.o.   MRN: [id number]  HPI  This is a 38 year old female presenting for vaccinations before traveling to the [place]. She is requesting Hepatitis A, Hepatitis B and influenza. She cannot recall having Hep B series before. She is up to date on Tdap. She denies fever or chills.  Review of Systems  Constitutional: Negative for fever and chills.  Gastrointestinal: Negative for nausea, vomiting and diarrhea.   There are no active problems to display for this patient.  Home Meds: None  No Known Allergies  Patient's social and family history were reviewed.     Objective:   Physical Exam  Constitutional: She is oriented to person, place, and time. She appears well-developed and well-nourished. No distress.  HENT:  Head: Normocephalic and atraumatic.  Right Ear: Hearing normal.  Left Ear: Hearing normal.  Nose: Nose normal.  Eyes: Conjunctivae and lids are normal. Right eye exhibits no discharge. Left eye exhibits no discharge. No scleral icterus.  Pulmonary/Chest: Effort normal. No respiratory distress.  Musculoskeletal: Normal range of motion.  Neurological: She is alert and oriented to person, place, and time.  Skin: Skin is warm, dry and intact. No lesion and no rash noted.  Psychiatric: She has a normal mood and affect. Her speech is normal and behavior is normal. Thought content normal.  BP 108/76 mmHg  Pulse 70  Temp(Src) 98 F (36.7 C) (Oral)  Resp 16  Ht 5\' 1"  (1.549 m)  Wt 131 lb 9.6 oz (59.693 kg)  BMI 24.88 kg/m2  SpO2 100%  LMP 09/09/2014     Assessment & Plan:  1. Need for hepatitis B vaccination She will return in 1 month for 2nd vaccine and in 6 months for 3rd vaccine. - Hepatitis B vaccine adult IM - Hepatitis B vaccine adult IM; Future - Hepatitis B vaccine adult IM; Future  2. Need for influenza vaccination - Flu Vaccine QUAD 36+ mos IM  3. Need for hepatitis A  immunization - Hepatitis A vaccine adult IM   Benjaman Pott. Drenda Freeze, MHS [facility]  09/20/2014

## 2014-09-18 NOTE — Patient Instructions (Signed)
Return in 1 month for 2nd hep B, and then 5 months after that for the 3rd.

## 2014-09-19 ENCOUNTER — Telehealth: Payer: Self-pay

## 2014-09-19 NOTE — Telephone Encounter (Signed)
Pt left v/m at 4:55 on 09/18/14 to ck and see if had Hep A & B shots; called pt this AM and pt had immunizations a [facility] last evening. Pt needed nothing further.

## 2014-09-23 NOTE — Progress Notes (Signed)
The patient was discussed with me and I agree with the diagnosis and treatment plan.  

## 2014-10-24 ENCOUNTER — Ambulatory Visit (INDEPENDENT_AMBULATORY_CARE_PROVIDER_SITE_OTHER): Payer: Self-pay | Admitting: *Deleted

## 2014-10-24 DIAGNOSIS — Z23 Encounter for immunization: Secondary | ICD-10-CM

## 2015-03-25 ENCOUNTER — Ambulatory Visit (INDEPENDENT_AMBULATORY_CARE_PROVIDER_SITE_OTHER): Payer: Self-pay | Admitting: Emergency Medicine

## 2015-03-25 VITALS — BP 112/79 | HR 81 | Temp 98.1°F | Resp 16 | Ht 61.5 in | Wt 135.0 lb

## 2015-03-25 DIAGNOSIS — Z09 Encounter for follow-up examination after completed treatment for conditions other than malignant neoplasm: Secondary | ICD-10-CM

## 2015-03-25 DIAGNOSIS — Z23 Encounter for immunization: Secondary | ICD-10-CM

## 2015-03-25 NOTE — Progress Notes (Signed)
   Subjective:  Patient ID: Brittney Steele, female    DOB: 1976/02/24  Age: 39 y.o. MRN: [id number]  CC: Immunizations   HPI Brittney Steele presents  for her third hepatitis B immunization.  Outpatient Prescriptions Prior to Visit  Medication Sig Dispense Refill  . ketoconazole (NIZORAL) 2 % shampoo Apply 1 application topically 2 (two) times a week.       No facility-administered medications prior to visit.    History   Social History  . Marital Status: Married    Spouse Name: N/A  . Number of Children: 2  . Years of Education: N/A   Occupational History  .  [employer]   Social History Main Topics  . Smoking status: Never Smoker   . Smokeless tobacco: Not on file  . Alcohol Use: No  . Drug Use: No  . Sexual Activity: No   Other Topics Concern  . None   Social History Narrative   Lives with husband and 2 daughters in [place]..Used to work in Science writer, now Probation officer      Regular exercise-yes    History reviewed. No pertinent family history.  History reviewed. No pertinent past medical history.   Review of Systems  Objective:  BP 112/79 mmHg  Pulse 81  Temp(Src) 98.1 F (36.7 C) (Oral)  Resp 16  Ht 5' 1.5" (1.562 m)  Wt 135 lb (61.236 kg)  BMI 25.10 kg/m2  SpO2 98%  LMP 03/25/2015  BP Readings from Last 3 Encounters:  03/25/15 112/79  09/18/14 108/76  09/23/13 104/62    Wt Readings from Last 3 Encounters:  03/25/15 135 lb (61.236 kg)  09/18/14 131 lb 9.6 oz (59.693 kg)  09/23/13 145 lb 8 oz (65.998 kg)    Physical Exam  Lab Results  Component Value Date   WBC 4.7 08/28/2013   HGB 11.7* 08/28/2013   HCT 35.4* 08/28/2013   PLT 201.0 08/28/2013   GLUCOSE 90 08/28/2013   CHOL 171 08/28/2013   TRIG 60.0 08/28/2013   HDL 61.50 08/28/2013   LDLCALC 98 08/28/2013   ALT 10 08/28/2013   AST 14 08/28/2013   NA 139 08/28/2013   K 3.8 08/28/2013   CL 107 08/28/2013   CREATININE 0.8 08/28/2013   BUN 10 08/28/2013   CO2  26 08/28/2013   TSH 0.89 08/28/2013      .  Assessment & Plan:   [name] was seen today for immunizations.  Diagnoses and all orders for this visit:  Need for immunization follow-up Orders: -     Hepatitis B vaccine adult IM   I am having [name] maintain her ketoconazole.  No orders of the defined types were placed in this encounter.    Appropriate red flag conditions were discussed with the patient as well as actions that should be taken.  Patient expressed his understanding.  Follow-up: No Follow-up on file.  Roselee Culver, MD

## 2015-04-29 ENCOUNTER — Other Ambulatory Visit: Payer: Self-pay | Admitting: Obstetrics and Gynecology

## 2015-04-30 LAB — CYTOLOGY - PAP

## 2015-06-16 ENCOUNTER — Encounter: Payer: Self-pay | Admitting: Internal Medicine

## 2015-06-16 ENCOUNTER — Ambulatory Visit (INDEPENDENT_AMBULATORY_CARE_PROVIDER_SITE_OTHER): Payer: BLUE CROSS/BLUE SHIELD | Admitting: Internal Medicine

## 2015-06-16 VITALS — BP 116/62 | HR 61 | Temp 98.5°F | Ht 61.0 in | Wt 139.0 lb

## 2015-06-16 DIAGNOSIS — Z0000 Encounter for general adult medical examination without abnormal findings: Secondary | ICD-10-CM | POA: Diagnosis not present

## 2015-06-16 DIAGNOSIS — Z23 Encounter for immunization: Secondary | ICD-10-CM | POA: Diagnosis not present

## 2015-06-16 LAB — LIPID PANEL
CHOLESTEROL: 151 mg/dL (ref 0–200)
HDL: 48.8 mg/dL (ref >39.00)
LDL Cholesterol: 89 mg/dL (ref 0–99)
NonHDL: 102.59
Total CHOL/HDL Ratio: 3
Triglycerides: 67 mg/dL (ref 0.0–149.0)
VLDL: 13.4 mg/dL (ref 0.0–40.0)

## 2015-06-16 LAB — CBC
HEMATOCRIT: 35.8 % — AB (ref 36.0–46.0)
Hemoglobin: 11.6 g/dL — ABNORMAL LOW (ref 12.0–15.0)
MCHC: 32.5 g/dL (ref 30.0–36.0)
MCV: 84.4 fl (ref 78.0–100.0)
Platelets: 217 10*3/uL (ref 150.0–400.0)
RBC: 4.24 Mil/uL (ref 3.87–5.11)
RDW: 13.3 % (ref 11.5–15.5)
WBC: 6.4 10*3/uL (ref 4.0–10.5)

## 2015-06-16 LAB — TSH: TSH: 2.48 u[IU]/mL (ref 0.35–4.50)

## 2015-06-16 LAB — COMPREHENSIVE METABOLIC PANEL
ALBUMIN: 4.1 g/dL (ref 3.5–5.2)
ALT: 8 U/L (ref 0–35)
AST: 12 U/L (ref 0–37)
Alkaline Phosphatase: 43 U/L (ref 39–117)
BUN: 11 mg/dL (ref 6–23)
CALCIUM: 9.1 mg/dL (ref 8.4–10.5)
CHLORIDE: 107 meq/L (ref 96–112)
CO2: 29 meq/L (ref 19–32)
Creatinine, Ser: 0.78 mg/dL (ref 0.40–1.20)
GFR: 106.65 mL/min (ref >60.00)
Glucose, Bld: 79 mg/dL (ref 70–99)
POTASSIUM: 4 meq/L (ref 3.5–5.1)
SODIUM: 143 meq/L (ref 135–145)
Total Bilirubin: 0.4 mg/dL (ref 0.2–1.2)
Total Protein: 6.4 g/dL (ref 6.0–8.3)

## 2015-06-16 LAB — HEMOGLOBIN A1C: Hgb A1c MFr Bld: 5.2 % (ref 4.6–6.5)

## 2015-06-16 NOTE — Addendum Note (Signed)
Addended by: Lurlean Nanny on: 06/16/2015 08:39 AM   Modules accepted: Orders

## 2015-06-16 NOTE — Patient Instructions (Signed)

## 2015-06-16 NOTE — Addendum Note (Signed)
Addended by: Lurlean Nanny on: 06/16/2015 10:19 AM   Modules accepted: Orders

## 2015-06-16 NOTE — Progress Notes (Signed)
Subjective:    Patient ID: Brittney Steele, female    DOB: 1976/07/24, 39 y.o.   MRN: [id number]  HPI  Pt presents to the clinic today for her annual exam.  Flu: 09/2014 Tetanus: 09/2013 LMP: 05/26/15 Pap Smear: 04/2015 Dentist: biannually  Diet: She consumes a balanced diet. She does eat meat. She has fruits and veggies daily. She tries to avoid fried and fatty foods. She drinks mostly water. Exercise: She walks for about 30 minutes a few days per week  Review of Systems      No past medical history on file.  Current Outpatient Prescriptions  Medication Sig Dispense Refill  . ketoconazole (NIZORAL) 2 % shampoo Apply 1 application topically 2 (two) times a week.       No current facility-administered medications for this visit.    No Known Allergies  No family history on file.  Social History   Social History  . Marital Status: Married    Spouse Name: N/A  . Number of Children: 2  . Years of Education: N/A   Occupational History  .  [employer]   Social History Main Topics  . Smoking status: Never Smoker   . Smokeless tobacco: Not on file  . Alcohol Use: No  . Drug Use: No  . Sexual Activity: No   Other Topics Concern  . Not on file   Social History Narrative   Lives with husband and 2 daughters in [place]..Used to work in Science writer, now Probation officer      Regular exercise-yes     Constitutional: Denies fever, malaise, fatigue, headache or abrupt weight changes.  HEENT: Denies eye pain, eye redness, ear pain, ringing in the ears, wax buildup, runny nose, nasal congestion, bloody nose, or sore throat. Respiratory: Denies difficulty breathing, shortness of breath, cough or sputum production.   Cardiovascular: Denies chest pain, chest tightness, palpitations or swelling in the hands or feet.  Gastrointestinal: Denies abdominal pain, bloating, constipation, diarrhea or blood in the stool.  GU: Denies urgency, frequency, pain with urination,  burning sensation, blood in urine, odor or discharge. Musculoskeletal: Denies decrease in range of motion, difficulty with gait, muscle pain or joint pain and swelling.  Skin: Denies redness, rashes, lesions or ulcercations.  Neurological: Denies dizziness, difficulty with memory, difficulty with speech or problems with balance and coordination.  Psych: Denies anxiety, depression, SI/HI.  No other specific complaints in a complete review of systems (except as listed in HPI above).  Objective:   Physical Exam   BP 116/62 mmHg  Pulse 61  Temp(Src) 98.5 F (36.9 C) (Oral)  Ht 5\' 1"  (1.549 m)  Wt 139 lb (63.05 kg)  BMI 26.28 kg/m2  SpO2 99%  LMP 05/26/2015 Wt Readings from Last 3 Encounters:  06/16/15 139 lb (63.05 kg)  03/25/15 135 lb (61.236 kg)  09/18/14 131 lb 9.6 oz (59.693 kg)    General: Appears her stated age, well developed, well nourished in NAD. Skin: Warm, dry and intact. No rashes, lesions or ulcerations noted. HEENT: Head: normal shape and size; Eyes: sclera white, no icterus, conjunctiva pink, PERRLA and EOMs intact; Ears: Tm's gray and intact, normal light reflex; Throat/Mouth: Teeth present, mucosa pink and moist, no exudate, lesions or ulcerations noted.  Neck:  Neck supple, trachea midline. No masses, lumps or thyromegaly present.  Cardiovascular: Normal rate and rhythm. S1,S2 noted.  No murmur, rubs or gallops noted. No JVD or BLE edema.  Pulmonary/Chest: Normal effort and positive vesicular breath sounds.  No respiratory distress. No wheezes, rales or ronchi noted.  Abdomen: Soft and nontender. Normal bowel sounds, no bruits noted. No distention or masses noted. Liver, spleen and kidneys non palpable. Musculoskeletal: Normal range of motion. Strength 5/5 BUE/BLE. No signs of joint swelling. No difficulty with gait.  Neurological: Alert and oriented. Cranial nerves II-XII grossly intact. Coordination normal.  Psychiatric: Mood and affect normal. Behavior is normal.  Judgment and thought content normal.     BMET    Component Value Date/Time   NA 139 08/28/2013 0835   K 3.8 08/28/2013 0835   CL 107 08/28/2013 0835   CO2 26 08/28/2013 0835   GLUCOSE 90 08/28/2013 0835   BUN 10 08/28/2013 0835   CREATININE 0.8 08/28/2013 0835   CALCIUM 9.1 08/28/2013 0835   GFRNONAA 126.25 10/06/2010 0819    Lipid Panel     Component Value Date/Time   CHOL 171 08/28/2013 0835   TRIG 60.0 08/28/2013 0835   HDL 61.50 08/28/2013 0835   CHOLHDL 3 08/28/2013 0835   VLDL 12.0 08/28/2013 0835   LDLCALC 98 08/28/2013 0835    CBC    Component Value Date/Time   WBC 4.7 08/28/2013 0835   RBC 4.22 08/28/2013 0835   HGB 11.7* 08/28/2013 0835   HCT 35.4* 08/28/2013 0835   PLT 201.0 08/28/2013 0835   MCV 83.9 08/28/2013 0835   MCH 27.8 05/03/2013 2339   MCHC 33.0 08/28/2013 0835   RDW 12.1 08/28/2013 0835   LYMPHSABS 2.8 10/06/2010 0819   MONOABS 0.5 10/06/2010 0819   EOSABS 0.0 10/06/2010 0819   BASOSABS 0.0 10/06/2010 0819    Hgb A1C No results found for: HGBA1C      Assessment & Plan:   Preventative Health Maintenance:  Flu shot today Tetanus UTD Pap Smear UTD Encouraged her to continue a healthy exercise and diet regimen Encouraged her to continue seeing a dentist at least annually Will check CBC, CMET, Lipid, TSH and A1C  RTC in 1 year or sooner if needed

## 2015-06-16 NOTE — Progress Notes (Signed)
Pre visit review using our clinic review tool, if applicable. No additional management support is needed unless otherwise documented below in the visit note. 

## 2015-06-18 NOTE — Addendum Note (Signed)
Addended by: Jearld Fenton on: 06/18/2015 12:13 PM   Modules accepted: Miquel Dunn

## 2015-10-19 ENCOUNTER — Ambulatory Visit (INDEPENDENT_AMBULATORY_CARE_PROVIDER_SITE_OTHER): Payer: BLUE CROSS/BLUE SHIELD | Admitting: Internal Medicine

## 2015-10-19 ENCOUNTER — Encounter: Payer: Self-pay | Admitting: Internal Medicine

## 2015-10-19 VITALS — BP 108/74 | HR 103 | Temp 98.2°F | Wt 147.0 lb

## 2015-10-19 DIAGNOSIS — L500 Allergic urticaria: Secondary | ICD-10-CM

## 2015-10-19 MED ORDER — PREDNISONE 10 MG PO TABS: ORAL_TABLET | ORAL | Status: DC

## 2015-10-19 NOTE — Patient Instructions (Signed)
Hives Hives are itchy, red, swollen areas of the skin. They can vary in size and location on your body. Hives can come and go for hours or several days (acute hives) or for several weeks (chronic hives). Hives do not spread from person to person (noncontagious). They may get worse with scratching, exercise, and emotional stress. CAUSES   Allergic reaction to food, additives, or drugs.  Infections, including the common cold.  Illness, such as vasculitis, lupus, or thyroid disease.  Exposure to sunlight, heat, or cold.  Exercise.  Stress.  Contact with chemicals. SYMPTOMS   Red or white swollen patches on the skin. The patches may change size, shape, and location quickly and repeatedly.  Itching.  Swelling of the hands, feet, and face. This may occur if hives develop deeper in the skin. DIAGNOSIS  Your caregiver can usually tell what is wrong by performing a physical exam. Skin or blood tests may also be done to determine the cause of your hives. In some cases, the cause cannot be determined. TREATMENT  Mild cases usually get better with medicines such as antihistamines. Severe cases may require an emergency epinephrine injection. If the cause of your hives is known, treatment includes avoiding that trigger.  HOME CARE INSTRUCTIONS   Avoid causes that trigger your hives.  Take antihistamines as directed by your caregiver to reduce the severity of your hives. Non-sedating or low-sedating antihistamines are usually recommended. Do not drive while taking an antihistamine.  Take any other medicines prescribed for itching as directed by your caregiver.  Wear loose-fitting clothing.  Keep all follow-up appointments as directed by your caregiver. SEEK MEDICAL CARE IF:   You have persistent or severe itching that is not relieved with medicine.  You have painful or swollen joints. SEEK IMMEDIATE MEDICAL CARE IF:   You have a fever.  Your tongue or lips are swollen.  You have  trouble breathing or swallowing.  You feel tightness in the throat or chest.  You have abdominal pain. These problems may be the first sign of a life-threatening allergic reaction. Call your local emergency services (911 in [place] MAKE SURE YOU:   Understand these instructions.  Will watch your condition.  Will get help right away if you are not doing well or get worse.   This information is not intended to replace advice given to you by your health care provider. Make sure you discuss any questions you have with your health care provider.   Document Released: 09/26/2005 Document Revised: 10/01/2013 Document Reviewed: 12/20/2011 Elsevier Interactive Patient Education 2016 Elsevier Inc.  

## 2015-10-19 NOTE — Progress Notes (Signed)
Pre visit review using our clinic review tool, if applicable. No additional management support is needed unless otherwise documented below in the visit note. 

## 2015-10-19 NOTE — Progress Notes (Signed)
Subjective:    Patient ID: Brittney Steele, female    DOB: 1975/10/17, 40 y.o.   MRN: [id number]  HPI  Pt presents to the clinic today with c/o a rash. It started on her legs and has spread to her trunk arms and face. She noticed this 1 week ago. The rash is very itchy. She has tried Benadryl with some relief. She denies changes in soaps, lotions or detergents. She denies any new medications. She has not had a change in her diet. She has not had contacts with people who have had a similar rash.  Review of Systems  No past medical history on file.  No current outpatient prescriptions on file.   No current facility-administered medications for this visit.    No Known Allergies  No family history on file.  Social History   Social History  . Marital Status: Married    Spouse Name: N/A  . Number of Children: 2  . Years of Education: N/A   Occupational History  .  [employer]   Social History Main Topics  . Smoking status: Never Smoker   . Smokeless tobacco: Not on file  . Alcohol Use: No  . Drug Use: No  . Sexual Activity: No   Other Topics Concern  . Not on file   Social History Narrative   Lives with husband and 2 daughters in [place]..Used to work in Science writer, now Probation officer      Regular exercise-yes     Constitutional: Denies fever, malaise, fatigue, headache or abrupt weight changes.  Respiratory: Denies difficulty breathing, shortness of breath, cough or sputum production.   Cardiovascular: Denies chest pain, chest tightness, palpitations or swelling in the hands or feet.  Skin: Pt reports rash.Denies lesions or ulcercations.    No other specific complaints in a complete review of systems (except as listed in HPI above).     Objective:   Physical Exam  BP 108/74 mmHg  Pulse 103  Temp(Src) 98.2 F (36.8 C) (Oral)  Wt 147 lb (66.679 kg)  SpO2 98%  LMP 10/12/2015 Wt Readings from Last 3 Encounters:  10/19/15 147 lb (66.679 kg)    06/16/15 139 lb (63.05 kg)  03/25/15 135 lb (61.236 kg)    General: Appears her stated age, in NAD. Skin: Welps noted on bilateral legs. Cardiovascular: Normal rate and rhythm. S1,S2 noted.  No murmur, rubs or gallops noted.  Pulmonary/Chest: Normal effort and positive vesicular breath sounds. No respiratory distress. No wheezes, rales or ronchi noted.    BMET    Component Value Date/Time   NA 143 06/16/2015 0829   K 4.0 06/16/2015 0829   CL 107 06/16/2015 0829   CO2 29 06/16/2015 0829   GLUCOSE 79 06/16/2015 0829   BUN 11 06/16/2015 0829   CREATININE 0.78 06/16/2015 0829   CALCIUM 9.1 06/16/2015 0829   GFRNONAA 126.25 10/06/2010 0819    Lipid Panel     Component Value Date/Time   CHOL 151 06/16/2015 0829   TRIG 67.0 06/16/2015 0829   HDL 48.80 06/16/2015 0829   CHOLHDL 3 06/16/2015 0829   VLDL 13.4 06/16/2015 0829   LDLCALC 89 06/16/2015 0829    CBC    Component Value Date/Time   WBC 6.4 06/16/2015 0829   RBC 4.24 06/16/2015 0829   HGB 11.6* 06/16/2015 0829   HCT 35.8* 06/16/2015 0829   PLT 217.0 06/16/2015 0829   MCV 84.4 06/16/2015 0829   MCH 27.8 05/03/2013 2339   MCHC  32.5 06/16/2015 0829   RDW 13.3 06/16/2015 0829   LYMPHSABS 2.8 10/06/2010 0819   MONOABS 0.5 10/06/2010 0819   EOSABS 0.0 10/06/2010 0819   BASOSABS 0.0 10/06/2010 0819    Hgb A1C Lab Results  Component Value Date   HGBA1C 5.2 06/16/2015         Assessment & Plan:   Allergic Urticaria:  eRx for Pred Taper x 9 days If persist, consider referral to Allergist for testing Try to keep a log of things you are eating that may be causing the hives Ok to take Benadryl as needed  RTC as needed or if symptoms persist or worsen

## 2015-12-16 ENCOUNTER — Telehealth: Payer: Self-pay

## 2015-12-16 MED ORDER — SCOPOLAMINE 1 MG/3DAYS TD PT72: 1.0000 | MEDICATED_PATCH | TRANSDERMAL | Status: DC

## 2015-12-16 NOTE — Telephone Encounter (Signed)
eRx sent

## 2015-12-16 NOTE — Telephone Encounter (Signed)
Pt left v/m requesting patch for motion sickness to [place]; pt will be traveling [employer] and cruising) out of the country; leaving 12/19/15 and will be gone for 8 days.

## 2016-05-04 ENCOUNTER — Other Ambulatory Visit: Payer: Self-pay | Admitting: Obstetrics and Gynecology

## 2016-05-04 DIAGNOSIS — Z6827 Body mass index (BMI) 27.0-27.9, adult: Secondary | ICD-10-CM | POA: Diagnosis not present

## 2016-05-04 DIAGNOSIS — Z124 Encounter for screening for malignant neoplasm of cervix: Secondary | ICD-10-CM | POA: Diagnosis not present

## 2016-05-04 DIAGNOSIS — Z01419 Encounter for gynecological examination (general) (routine) without abnormal findings: Secondary | ICD-10-CM | POA: Diagnosis not present

## 2016-05-04 LAB — HM PAP SMEAR: HM PAP: NORMAL

## 2016-05-06 LAB — CYTOLOGY - PAP

## 2016-08-17 ENCOUNTER — Other Ambulatory Visit: Payer: Self-pay | Admitting: Obstetrics and Gynecology

## 2016-08-17 DIAGNOSIS — Z01419 Encounter for gynecological examination (general) (routine) without abnormal findings: Secondary | ICD-10-CM | POA: Diagnosis not present

## 2016-08-17 DIAGNOSIS — Z6830 Body mass index (BMI) 30.0-30.9, adult: Secondary | ICD-10-CM | POA: Diagnosis not present

## 2016-08-17 DIAGNOSIS — O09529 Supervision of elderly multigravida, unspecified trimester: Secondary | ICD-10-CM | POA: Insufficient documentation

## 2016-08-17 DIAGNOSIS — Z3480 Encounter for supervision of other normal pregnancy, unspecified trimester: Secondary | ICD-10-CM | POA: Diagnosis not present

## 2016-08-17 DIAGNOSIS — N925 Other specified irregular menstruation: Secondary | ICD-10-CM | POA: Diagnosis not present

## 2016-09-08 DIAGNOSIS — Z3480 Encounter for supervision of other normal pregnancy, unspecified trimester: Secondary | ICD-10-CM | POA: Diagnosis not present

## 2016-09-08 DIAGNOSIS — O09521 Supervision of elderly multigravida, first trimester: Secondary | ICD-10-CM | POA: Diagnosis not present

## 2016-09-08 DIAGNOSIS — Z369 Encounter for antenatal screening, unspecified: Secondary | ICD-10-CM | POA: Diagnosis not present

## 2016-09-08 LAB — OB RESULTS CONSOLE GC/CHLAMYDIA
Chlamydia: NEGATIVE
Gonorrhea: NEGATIVE

## 2016-09-08 LAB — OB RESULTS CONSOLE ABO/RH: RH TYPE: POSITIVE

## 2016-09-08 LAB — OB RESULTS CONSOLE RPR: RPR: NONREACTIVE

## 2016-09-08 LAB — OB RESULTS CONSOLE ANTIBODY SCREEN: Antibody Screen: NEGATIVE

## 2016-09-08 LAB — OB RESULTS CONSOLE HIV ANTIBODY (ROUTINE TESTING): HIV: NONREACTIVE

## 2016-09-08 LAB — OB RESULTS CONSOLE RUBELLA ANTIBODY, IGM: RUBELLA: IMMUNE

## 2016-09-08 LAB — OB RESULTS CONSOLE HEPATITIS B SURFACE ANTIGEN: HEP B S AG: NEGATIVE

## 2016-10-06 DIAGNOSIS — Z3480 Encounter for supervision of other normal pregnancy, unspecified trimester: Secondary | ICD-10-CM | POA: Diagnosis not present

## 2016-11-03 DIAGNOSIS — Z363 Encounter for antenatal screening for malformations: Secondary | ICD-10-CM | POA: Diagnosis not present

## 2016-11-03 DIAGNOSIS — Z6832 Body mass index (BMI) 32.0-32.9, adult: Secondary | ICD-10-CM | POA: Diagnosis not present

## 2016-11-28 DIAGNOSIS — O359XX1 Maternal care for (suspected) fetal abnormality and damage, unspecified, fetus 1: Secondary | ICD-10-CM | POA: Diagnosis not present

## 2016-12-26 DIAGNOSIS — Z23 Encounter for immunization: Secondary | ICD-10-CM | POA: Diagnosis not present

## 2016-12-26 DIAGNOSIS — Z6834 Body mass index (BMI) 34.0-34.9, adult: Secondary | ICD-10-CM | POA: Diagnosis not present

## 2016-12-26 DIAGNOSIS — Z3480 Encounter for supervision of other normal pregnancy, unspecified trimester: Secondary | ICD-10-CM | POA: Diagnosis not present

## 2016-12-30 ENCOUNTER — Encounter: Payer: Self-pay | Admitting: Internal Medicine

## 2017-01-23 ENCOUNTER — Encounter: Payer: Self-pay | Admitting: Internal Medicine

## 2017-02-08 ENCOUNTER — Other Ambulatory Visit: Payer: Self-pay | Admitting: Obstetrics and Gynecology

## 2017-02-24 ENCOUNTER — Other Ambulatory Visit: Payer: Self-pay | Admitting: Obstetrics and Gynecology

## 2017-02-24 DIAGNOSIS — Z3480 Encounter for supervision of other normal pregnancy, unspecified trimester: Secondary | ICD-10-CM | POA: Diagnosis not present

## 2017-02-24 DIAGNOSIS — O26849 Uterine size-date discrepancy, unspecified trimester: Secondary | ICD-10-CM | POA: Diagnosis not present

## 2017-02-24 LAB — OB RESULTS CONSOLE GBS: STREP GROUP B AG: NEGATIVE

## 2017-03-03 DIAGNOSIS — O09523 Supervision of elderly multigravida, third trimester: Secondary | ICD-10-CM | POA: Diagnosis not present

## 2017-03-10 DIAGNOSIS — O09523 Supervision of elderly multigravida, third trimester: Secondary | ICD-10-CM | POA: Diagnosis not present

## 2017-03-13 ENCOUNTER — Encounter (HOSPITAL_COMMUNITY): Payer: Self-pay | Admitting: *Deleted

## 2017-03-13 ENCOUNTER — Telehealth (HOSPITAL_COMMUNITY): Payer: Self-pay | Admitting: *Deleted

## 2017-03-13 NOTE — Telephone Encounter (Signed)
Preadmission screen  

## 2017-03-14 ENCOUNTER — Telehealth (HOSPITAL_COMMUNITY): Payer: Self-pay | Admitting: *Deleted

## 2017-03-14 NOTE — Telephone Encounter (Signed)
Preadmission screen  

## 2017-03-15 ENCOUNTER — Telehealth (HOSPITAL_COMMUNITY): Payer: Self-pay | Admitting: *Deleted

## 2017-03-15 DIAGNOSIS — O09523 Supervision of elderly multigravida, third trimester: Secondary | ICD-10-CM | POA: Diagnosis not present

## 2017-03-15 NOTE — Telephone Encounter (Signed)
Preadmission screen  

## 2017-03-16 ENCOUNTER — Telehealth (HOSPITAL_COMMUNITY): Payer: Self-pay | Admitting: *Deleted

## 2017-03-16 NOTE — Telephone Encounter (Signed)
Preadmission screen  

## 2017-03-17 ENCOUNTER — Telehealth (HOSPITAL_COMMUNITY): Payer: Self-pay | Admitting: *Deleted

## 2017-03-17 NOTE — Telephone Encounter (Signed)
Preadmission screen  

## 2017-03-21 ENCOUNTER — Encounter (HOSPITAL_COMMUNITY): Payer: Self-pay

## 2017-03-21 ENCOUNTER — Encounter (HOSPITAL_COMMUNITY)
Admission: RE | Admit: 2017-03-21 | Discharge: 2017-03-21 | Disposition: A | Discharge status: Home / Self Care | Payer: BLUE CROSS/BLUE SHIELD | Source: Ambulatory Visit

## 2017-03-21 ENCOUNTER — Inpatient Hospital Stay (HOSPITAL_COMMUNITY): Payer: BLUE CROSS/BLUE SHIELD | Admitting: Anesthesiology

## 2017-03-21 ENCOUNTER — Inpatient Hospital Stay (HOSPITAL_COMMUNITY)
Admission: AD | Admit: 2017-03-21 | Discharge: 2017-03-23 | DRG: 766 | Disposition: A | Discharge status: Home / Self Care | Payer: BLUE CROSS/BLUE SHIELD | Source: Ambulatory Visit | Attending: Obstetrics and Gynecology | Admitting: Obstetrics and Gynecology

## 2017-03-21 ENCOUNTER — Encounter (HOSPITAL_COMMUNITY)
Admission: AD | Disposition: A | Discharge status: Home / Self Care | Payer: Self-pay | Source: Ambulatory Visit | Attending: Obstetrics and Gynecology

## 2017-03-21 DIAGNOSIS — Z3A38 38 weeks gestation of pregnancy: Secondary | ICD-10-CM | POA: Diagnosis not present

## 2017-03-21 DIAGNOSIS — O34219 Maternal care for unspecified type scar from previous cesarean delivery: Secondary | ICD-10-CM | POA: Diagnosis not present

## 2017-03-21 DIAGNOSIS — D252 Subserosal leiomyoma of uterus: Secondary | ICD-10-CM | POA: Diagnosis present

## 2017-03-21 DIAGNOSIS — O3413 Maternal care for benign tumor of corpus uteri, third trimester: Secondary | ICD-10-CM | POA: Diagnosis not present

## 2017-03-21 DIAGNOSIS — Z3490 Encounter for supervision of normal pregnancy, unspecified, unspecified trimester: Secondary | ICD-10-CM

## 2017-03-21 DIAGNOSIS — Z9889 Other specified postprocedural states: Secondary | ICD-10-CM

## 2017-03-21 DIAGNOSIS — O34211 Maternal care for low transverse scar from previous cesarean delivery: Principal | ICD-10-CM | POA: Diagnosis present

## 2017-03-21 HISTORY — DX: Benign neoplasm of connective and other soft tissue, unspecified: D21.9

## 2017-03-21 HISTORY — DX: Supervision of elderly multigravida, unspecified trimester: O09.529

## 2017-03-21 LAB — TYPE AND SCREEN
ABO/RH(D): B POS
Antibody Screen: NEGATIVE

## 2017-03-21 LAB — CBC
HCT: 37.7 % (ref 36.0–46.0)
Hemoglobin: 12.4 g/dL (ref 12.0–15.0)
MCH: 28.8 pg (ref 26.0–34.0)
MCHC: 32.9 g/dL (ref 30.0–36.0)
MCV: 87.5 fL (ref 78.0–100.0)
Platelets: 242 10*3/uL (ref 150–400)
RBC: 4.31 MIL/uL (ref 3.87–5.11)
RDW: 14.4 % (ref 11.5–15.5)
WBC: 12.8 10*3/uL — AB (ref 4.0–10.5)

## 2017-03-21 SURGERY — Surgical Case
Anesthesia: Epidural

## 2017-03-21 MED ORDER — ONDANSETRON HCL 4 MG/2ML IJ SOLN
INTRAMUSCULAR | Status: AC
  Filled 2017-03-21: qty 4

## 2017-03-21 MED ORDER — FENTANYL CITRATE (PF) 100 MCG/2ML IJ SOLN
INTRAMUSCULAR | Status: AC
  Filled 2017-03-21: qty 2

## 2017-03-21 MED ORDER — KETOROLAC TROMETHAMINE 30 MG/ML IJ SOLN
30.0000 mg | Freq: Four times a day (QID) | INTRAMUSCULAR | Status: AC | PRN
  Administered 2017-03-21: 30 mg via INTRAMUSCULAR

## 2017-03-21 MED ORDER — OXYCODONE-ACETAMINOPHEN 5-325 MG PO TABS
2.0000 | ORAL_TABLET | ORAL | Status: DC | PRN
  Administered 2017-03-21 – 2017-03-22 (×4): 2 via ORAL
  Filled 2017-03-21 (×4): qty 2

## 2017-03-21 MED ORDER — DIBUCAINE 1 % RE OINT
1.0000 | TOPICAL_OINTMENT | RECTAL | Status: DC | PRN
Start: 2017-03-21 — End: 2017-03-23

## 2017-03-21 MED ORDER — BUPIVACAINE IN DEXTROSE 0.75-8.25 % IT SOLN
INTRATHECAL | Status: DC | PRN
  Administered 2017-03-21: 1.6 mL via INTRATHECAL

## 2017-03-21 MED ORDER — SOD CITRATE-CITRIC ACID 500-334 MG/5ML PO SOLN
ORAL | Status: AC
  Administered 2017-03-21: 30 mL
  Filled 2017-03-21: qty 15

## 2017-03-21 MED ORDER — DIPHENHYDRAMINE HCL 25 MG PO CAPS
25.0000 mg | ORAL_CAPSULE | ORAL | Status: DC | PRN
  Filled 2017-03-21: qty 1

## 2017-03-21 MED ORDER — SIMETHICONE 80 MG PO CHEW
80.0000 mg | CHEWABLE_TABLET | ORAL | Status: DC
  Administered 2017-03-21 – 2017-03-22 (×3): 80 mg via ORAL
  Filled 2017-03-21 (×2): qty 1

## 2017-03-21 MED ORDER — COCONUT OIL OIL: 1.0000 "application " | TOPICAL_OIL | Status: DC | PRN

## 2017-03-21 MED ORDER — DIPHENHYDRAMINE HCL 50 MG/ML IJ SOLN: 12.5000 mg | INTRAMUSCULAR | Status: DC | PRN

## 2017-03-21 MED ORDER — IBUPROFEN 600 MG PO TABS
600.0000 mg | ORAL_TABLET | Freq: Four times a day (QID) | ORAL | Status: DC
  Administered 2017-03-22 – 2017-03-23 (×6): 600 mg via ORAL
  Filled 2017-03-21 (×6): qty 1

## 2017-03-21 MED ORDER — PROMETHAZINE HCL 25 MG/ML IJ SOLN: 6.2500 mg | INTRAMUSCULAR | Status: DC | PRN

## 2017-03-21 MED ORDER — LACTATED RINGERS IV SOLN
INTRAVENOUS | Status: DC | PRN
  Administered 2017-03-21: 17:00:00 via INTRAVENOUS

## 2017-03-21 MED ORDER — ERYTHROMYCIN 5 MG/GM OP OINT
TOPICAL_OINTMENT | OPHTHALMIC | Status: AC
  Filled 2017-03-21: qty 1

## 2017-03-21 MED ORDER — MEASLES, MUMPS & RUBELLA VAC ~~LOC~~ INJ: 0.5000 mL | INJECTION | Freq: Once | SUBCUTANEOUS | Status: DC

## 2017-03-21 MED ORDER — PHENYLEPHRINE 8 MG IN D5W 100 ML (0.08MG/ML) PREMIX OPTIME
INJECTION | INTRAVENOUS | Status: DC | PRN
  Administered 2017-03-21: 60 ug/min via INTRAVENOUS

## 2017-03-21 MED ORDER — OXYTOCIN 40 UNITS IN LACTATED RINGERS INFUSION - SIMPLE MED: 2.5000 [IU]/h | INTRAVENOUS | Status: AC

## 2017-03-21 MED ORDER — ACETAMINOPHEN 325 MG PO TABS: 650.0000 mg | ORAL_TABLET | ORAL | Status: DC | PRN

## 2017-03-21 MED ORDER — ZOLPIDEM TARTRATE 5 MG PO TABS: 5.0000 mg | ORAL_TABLET | Freq: Every evening | ORAL | Status: DC | PRN

## 2017-03-21 MED ORDER — ONDANSETRON HCL 4 MG/2ML IJ SOLN
INTRAMUSCULAR | Status: DC | PRN
  Administered 2017-03-21: 4 mg via INTRAVENOUS

## 2017-03-21 MED ORDER — FAMOTIDINE IN NACL 20-0.9 MG/50ML-% IV SOLN
INTRAVENOUS | Status: AC
  Administered 2017-03-21: 20 mg
  Filled 2017-03-21: qty 50

## 2017-03-21 MED ORDER — FENTANYL CITRATE (PF) 100 MCG/2ML IJ SOLN
INTRAMUSCULAR | Status: DC | PRN
Start: 2017-03-21 — End: 2017-03-21
  Administered 2017-03-21: 20 ug via INTRATHECAL

## 2017-03-21 MED ORDER — WITCH HAZEL-GLYCERIN EX PADS: 1.0000 "application " | MEDICATED_PAD | CUTANEOUS | Status: DC | PRN

## 2017-03-21 MED ORDER — KETOROLAC TROMETHAMINE 30 MG/ML IJ SOLN
INTRAMUSCULAR | Status: AC
  Filled 2017-03-21: qty 1

## 2017-03-21 MED ORDER — TETANUS-DIPHTH-ACELL PERTUSSIS 5-2.5-18.5 LF-MCG/0.5 IM SUSP: 0.5000 mL | Freq: Once | INTRAMUSCULAR | Status: DC

## 2017-03-21 MED ORDER — MORPHINE SULFATE (PF) 0.5 MG/ML IJ SOLN
INTRAMUSCULAR | Status: AC
  Filled 2017-03-21: qty 10

## 2017-03-21 MED ORDER — LACTATED RINGERS IV SOLN
INTRAVENOUS | Status: DC
  Administered 2017-03-21 (×2): via INTRAVENOUS

## 2017-03-21 MED ORDER — KETOROLAC TROMETHAMINE 30 MG/ML IJ SOLN: 30.0000 mg | Freq: Four times a day (QID) | INTRAMUSCULAR | Status: AC | PRN

## 2017-03-21 MED ORDER — PRENATAL MULTIVITAMIN CH
1.0000 | ORAL_TABLET | Freq: Every day | ORAL | Status: DC
  Administered 2017-03-22: 1 via ORAL
  Filled 2017-03-21: qty 1

## 2017-03-21 MED ORDER — SCOPOLAMINE 1 MG/3DAYS TD PT72
MEDICATED_PATCH | TRANSDERMAL | Status: DC | PRN
  Administered 2017-03-21: 1 via TRANSDERMAL

## 2017-03-21 MED ORDER — MEPERIDINE HCL 25 MG/ML IJ SOLN: 6.2500 mg | INTRAMUSCULAR | Status: DC | PRN

## 2017-03-21 MED ORDER — FENTANYL CITRATE (PF) 100 MCG/2ML IJ SOLN: INTRAMUSCULAR | Status: DC | PRN

## 2017-03-21 MED ORDER — SCOPOLAMINE 1 MG/3DAYS TD PT72
MEDICATED_PATCH | TRANSDERMAL | Status: AC
  Filled 2017-03-21: qty 1

## 2017-03-21 MED ORDER — OXYCODONE-ACETAMINOPHEN 5-325 MG PO TABS
1.0000 | ORAL_TABLET | ORAL | Status: DC | PRN
  Administered 2017-03-22: 1 via ORAL
  Filled 2017-03-21 (×2): qty 1

## 2017-03-21 MED ORDER — SENNOSIDES-DOCUSATE SODIUM 8.6-50 MG PO TABS
2.0000 | ORAL_TABLET | ORAL | Status: DC
  Administered 2017-03-21 – 2017-03-22 (×2): 2 via ORAL
  Filled 2017-03-21 (×2): qty 2

## 2017-03-21 MED ORDER — PHENYLEPHRINE 8 MG IN D5W 100 ML (0.08MG/ML) PREMIX OPTIME
INJECTION | INTRAVENOUS | Status: AC
  Filled 2017-03-21: qty 100

## 2017-03-21 MED ORDER — OXYTOCIN 10 UNIT/ML IJ SOLN
INTRAMUSCULAR | Status: AC
  Filled 2017-03-21: qty 4

## 2017-03-21 MED ORDER — LACTATED RINGERS IV SOLN
INTRAVENOUS | Status: DC
  Administered 2017-03-22: 04:00:00 via INTRAVENOUS

## 2017-03-21 MED ORDER — OXYTOCIN 10 UNIT/ML IJ SOLN
INTRAVENOUS | Status: DC | PRN
  Administered 2017-03-21: 40 [IU] via INTRAVENOUS

## 2017-03-21 SURGICAL SUPPLY — 30 items
APL SKNCLS STERI-STRIP NONHPOA (GAUZE/BANDAGES/DRESSINGS) ×1
BENZOIN TINCTURE PRP APPL 2/3 (GAUZE/BANDAGES/DRESSINGS) ×2 IMPLANT
CHLORAPREP W/TINT 26ML (MISCELLANEOUS) ×3 IMPLANT
CLAMP CORD UMBIL (MISCELLANEOUS) IMPLANT
CLOSURE WOUND 1/2 X4 (GAUZE/BANDAGES/DRESSINGS) ×1
CLOTH BEACON ORANGE TIMEOUT ST (SAFETY) ×3 IMPLANT
CONTAINER PREFILL 10% NBF 15ML (MISCELLANEOUS) IMPLANT
DRSG OPSITE POSTOP 4X10 (GAUZE/BANDAGES/DRESSINGS) ×3 IMPLANT
ELECT REM PT RETURN 9FT ADLT (ELECTROSURGICAL) ×3
ELECTRODE REM PT RTRN 9FT ADLT (ELECTROSURGICAL) ×1 IMPLANT
EXTRACTOR VACUUM M CUP 4 TUBE (SUCTIONS) IMPLANT
EXTRACTOR VACUUM M CUP 4' TUBE (SUCTIONS)
GLOVE BIOGEL PI IND STRL 7.0 (GLOVE) ×1 IMPLANT
GLOVE BIOGEL PI INDICATOR 7.0 (GLOVE) ×2
GLOVE ECLIPSE 7.0 STRL STRAW (GLOVE) ×6 IMPLANT
GOWN STRL REUS W/TWL LRG LVL3 (GOWN DISPOSABLE) ×6 IMPLANT
KIT ABG SYR 3ML LUER SLIP (SYRINGE) IMPLANT
NDL HYPO 25X5/8 SAFETYGLIDE (NEEDLE) IMPLANT
NEEDLE HYPO 25X5/8 SAFETYGLIDE (NEEDLE) IMPLANT
NS IRRIG 1000ML POUR BTL (IV SOLUTION) ×3 IMPLANT
PACK C SECTION WH (CUSTOM PROCEDURE TRAY) ×3 IMPLANT
PAD OB MATERNITY 4.3X12.25 (PERSONAL CARE ITEMS) ×3 IMPLANT
STRIP CLOSURE SKIN 1/2X4 (GAUZE/BANDAGES/DRESSINGS) ×1 IMPLANT
SUT MNCRL 0 VIOLET CTX 36 (SUTURE) ×3 IMPLANT
SUT MON AB 2-0 CT1 27 (SUTURE) ×6 IMPLANT
SUT MONOCRYL 0 CTX 36 (SUTURE) ×6
SUT PLAIN 0 NONE (SUTURE) IMPLANT
SUT VIC AB 4-0 KS 27 (SUTURE) ×2 IMPLANT
TOWEL OR 17X24 6PK STRL BLUE (TOWEL DISPOSABLE) ×3 IMPLANT
TRAY FOLEY BAG SILVER LF 14FR (SET/KITS/TRAYS/PACK) IMPLANT

## 2017-03-21 NOTE — H&P (Signed)
Brittney Steele is an 41 y.o. [phone] [redacted]w[redacted]d black female who presents to the ER in labor. She has been having painful ctxs for 8 hours.She is having heavy breathing and crying with the ctxs. She is scheduled for a repeat c/s tomorrow.  Chief Complaint: HPI:  Past Medical History:  Diagnosis Date  . AMA (advanced maternal age) multigravida 51+   . Fibroid     Past Surgical History:  Procedure Laterality Date  . CESAREAN SECTION    . DILATION AND CURETTAGE OF UTERUS    . DILATION AND EVACUATION N/A 05/04/2013   Procedure: DILATATION AND EVACUATION;  Surgeon: Allyn Kenner, DO;  Location: [facility];  Service: Gynecology;  Laterality: N/A;  . HERNIA REPAIR      History reviewed. No pertinent family history. Social History:  reports that she has never smoked. She does not have any smokeless tobacco history on file. She reports that she does not drink alcohol or use drugs.  Allergies:  Allergies  Allergen Reactions  . Duramorph [Morphine] Itching    Medications Prior to Admission  Medication Sig Dispense Refill  . Prenatal Vit-Fe Fumarate-FA (PRENATAL MULTIVITAMIN) TABS tablet Take 2 tablets by mouth daily at 12 noon.    . ranitidine (ZANTAC) 150 MG tablet Take 150 mg by mouth daily.         Blood pressure 126/84, pulse 95, temperature 98.1 F (36.7 C), temperature source Oral, resp. rate 18, height 5\' 1"  (1.549 m), weight 195 lb (88.5 kg). General appearance: alert and cooperative Lungs: clear to auscultation bilaterally Abdomen: soft, non-tender; bowel sounds normal; no masses,  no organomegaly gravid   Lab Results  Component Value Date   WBC 12.8 (H) 03/21/2017   HGB 12.4 03/21/2017   HCT 37.7 03/21/2017   MCV 87.5 03/21/2017   PLT 242 03/21/2017   No results found for: PREGTESTUR, PREGSERUM, HCG, HCGQUANT   There are no active problems to display for this patient.  IMP/ IUP at 38 6/7 weeks in labor         H.O. Prior c/s x 2  Plan/ Repeat c/s  Brittney Steele  E 03/21/2017, 3:56 PM

## 2017-03-21 NOTE — Anesthesia Preprocedure Evaluation (Signed)
Anesthesia Evaluation  Patient identified by MRN, date of birth, ID band Patient awake    Reviewed: Allergy & Precautions, H&P , Patient's Chart, lab work & pertinent test results, reviewed documented beta blocker date and time   Airway Mallampati: II  TM Distance: >3 FB Neck ROM: full    Dental no notable dental hx.    Pulmonary    Pulmonary exam normal breath sounds clear to auscultation       Cardiovascular  Rhythm:regular Rate:Normal     Neuro/Psych    GI/Hepatic   Endo/Other    Renal/GU      Musculoskeletal   Abdominal   Peds  Hematology   Anesthesia Other Findings   Reproductive/Obstetrics (+) Pregnancy                             Anesthesia Physical  Anesthesia Plan  ASA: II  Anesthesia Plan: Epidural   Post-op Pain Management:    Induction:   PONV Risk Score and Plan:   Airway Management Planned:   Additional Equipment:   Intra-op Plan:   Post-operative Plan:   Informed Consent: I have reviewed the patients History and Physical, chart, labs and discussed the procedure including the risks, benefits and alternatives for the proposed anesthesia with the patient or authorized representative who has indicated his/her understanding and acceptance.   Dental Advisory Given  Plan Discussed with: CRNA and Surgeon  Anesthesia Plan Comments:         Anesthesia Quick Evaluation

## 2017-03-21 NOTE — Patient Instructions (Signed)
22 Brittney Steele  03/21/2017   Your procedure is scheduled on:  03/22/2017  Enter through the Main Entrance of [facility] [place] up the phone at the desk and dial [phone].   Call this number if you have problems the morning of surgery: [phone]   Remember:   Do not eat food:After Midnight.  Do not drink clear liquids: After Midnight.  Take these medicines the morning of surgery with A SIP OF WATER: none   Do not wear jewelry, make-up or nail polish.  Do not wear lotions, powders, or perfumes. Do not wear deodorant.  Do not shave 48 hours prior to surgery.  Do not bring valuables to the hospital.  [facility] is not   responsible for any belongings or valuables brought to the hospital.  Contacts, dentures or bridgework may not be worn into surgery.  Leave suitcase in the car. After surgery it may be brought to your room.  For patients admitted to the hospital, checkout time is 11:00 AM the day of              discharge.   Patients discharged the day of surgery will not be allowed to drive             home.  Name and phone number of your driver: none  Special Instructions:   N/A   Please read over the following fact sheets that you were given:   Surgical Site Infection Prevention

## 2017-03-21 NOTE — Anesthesia Procedure Notes (Signed)
Spinal  Patient location during procedure: OB Start time: 03/21/2017 4:21 PM End time: 03/21/2017 4:26 PM Staffing Anesthesiologist: Candida Peeling RAY Performed: anesthesiologist  Preanesthetic Checklist Completed: patient identified, surgical consent, pre-op evaluation, timeout performed, IV checked, risks and benefits discussed and monitors and equipment checked Spinal Block Patient position: sitting Prep: Betadine and site prepped and draped Patient monitoring: heart rate, cardiac monitor, continuous pulse ox and blood pressure Approach: midline Location: L3-4 Injection technique: single-shot Needle Needle type: Pencan  Needle gauge: 24 G Needle length: 10 cm Assessment Sensory level: T4

## 2017-03-21 NOTE — Transfer of Care (Signed)
Immediate Anesthesia Transfer of Care Note  Patient: Brittney Steele  Procedure(s) Performed: Procedure(s): REPEAT CESAREAN SECTION (N/A)  Patient Location: PACU  Anesthesia Type:Spinal  Level of Consciousness: awake, alert  and oriented  Airway & Oxygen Therapy: Patient Spontanous Breathing  Post-op Assessment: Report given to RN and Post -op Vital signs reviewed and stable  Post vital signs: Reviewed and stable  Last Vitals:  Vitals:   03/21/17 1523  BP: 126/84  Pulse: 95  Resp: 18  Temp: 36.7 C    Last Pain:  Vitals:   03/21/17 1523  TempSrc: Oral  PainSc: 10-Worst pain ever      Patients Stated Pain Goal: 0 (58/59/29 2446)  Complications: No apparent anesthesia complications

## 2017-03-21 NOTE — MAU Note (Signed)
Patient presents with c/o ctx every 56mins. Patient is having some bloody show. GBS -

## 2017-03-21 NOTE — Pre-Procedure Instructions (Signed)
Pt presented for pat visit.  Contracting since "this morning"  Now 5-7 min apart moderate to palpation.  Pt has not eaten since 0400.  Move pt to MAU for R/O labor.  Dr Ouida Sills notified.

## 2017-03-22 ENCOUNTER — Encounter (HOSPITAL_COMMUNITY): Payer: Self-pay | Admitting: Obstetrics and Gynecology

## 2017-03-22 LAB — CBC
HEMATOCRIT: 32.8 % — AB (ref 36.0–46.0)
Hemoglobin: 10.8 g/dL — ABNORMAL LOW (ref 12.0–15.0)
MCH: 28.9 pg (ref 26.0–34.0)
MCHC: 32.9 g/dL (ref 30.0–36.0)
MCV: 87.7 fL (ref 78.0–100.0)
Platelets: 207 10*3/uL (ref 150–400)
RBC: 3.74 MIL/uL — AB (ref 3.87–5.11)
RDW: 14.5 % (ref 11.5–15.5)
WBC: 10.4 10*3/uL (ref 4.0–10.5)

## 2017-03-22 LAB — RPR: RPR: NONREACTIVE

## 2017-03-22 NOTE — Lactation Note (Signed)
This note was copied from a baby's chart. Lactation Consultation Note  Patient Name: Brittney Steele Date: 03/22/2017 Reason for consult: Initial assessment  Baby is 49 hours old  LC was asked by Dr. Doneen Poisson to assess breast feeding latch due to finding of a short to tight anterior  Frenulum. As LC entered room , baby stirring in crib, LC changed wet diaper. And offered to assist with latch to assess  Latch. LC discussed with moms findings with the oral assessment. - LC noted a short to very close to a tight anterior  Frenulum, upper lip stretches well with exam and when latched, high palate. When sucking on the the gloved finger of the  LC, noted the baby to recesses tongue intermittently back wards , did not note humping of the tongue.  Baby unable to raise tongue above the corners of the mouth or extend over the gum line.  When latching - 1st reviewed hand expressing with mom , several drops noted, baby opens wide and obtains the depth , no dimpling noted and keeps upper lip flanged, swallows noted, baby latched 3 different times @ consult 13 mins , 10 mins ,  And 3rd latch after 2nd wet diaper change. All 3 latches were with depth, swallows, per  Mom aliitle discomfort, but nothing she couldn't handle. Nipple well rounded when the baby released.  Latching in football both breast and modified laid back when re-latch on the left breast and still feeding when consult ended.   LC reported to Dr. Doneen Poisson regarding the [facility] assessment and oral assessment on this baby.  LC stressed the fact the latch is good today , but noted the baby is feeding for short intervals and often.  Also when the milk comes in the latch will be more difficult to sustain due to the tongue restrictions.  Dr. Doneen Poisson aware LC had the discussion with mom regarding tongue mobility limitations with breast feeding.   Mother informed of post-discharge support and given phone number to the lactation department,  including services for phone call assistance; out-patient appointments; and breastfeeding support group. List of other breastfeeding resources in the community given in the handout. Encouraged mother to call for problems or concerns related to breastfeeding.    Maternal Data Has patient been taught Hand Expression?: Yes Does the patient have breastfeeding experience prior to this delivery?: Yes  Feeding Feeding Type: Breast Fed Length of feed:  (swallows, inc w/ breast compressions laid back )  LATCH Score/Interventions Latch: Grasps breast easily, tongue down, lips flanged, rhythmical sucking.  Audible Swallowing: A few with stimulation Intervention(s): Skin to skin;Hand expression;Alternate breast massage  Type of Nipple: Everted at rest and after stimulation  Comfort (Breast/Nipple): Soft / non-tender     Hold (Positioning): Assistance needed to correctly position infant at breast and maintain latch.  LATCH Score: 8  Lactation Tools Discussed/Used     Consult Status Consult Status: Follow-up Date: 03/23/17 Follow-up type: In-patient    [place] 03/22/2017, 3:31 PM

## 2017-03-22 NOTE — Discharge Summary (Signed)
Obstetric Discharge Summary Reason for Admission: onset of labor and cesarean section Prenatal Procedures: NST Intrapartum Procedures: cesarean: low cervical, transverse Postpartum Procedures: none Complications-Operative and Postpartum: none Hemoglobin  Date Value Ref Range Status  03/22/2017 10.8 (L) 12.0 - 15.0 g/dL Final   HCT  Date Value Ref Range Status  03/22/2017 32.8 (L) 36.0 - 46.0 % Final    Discharge Diagnoses: Term Pregnancy-delivered  Discharge Information: Date: 03/22/2017 Activity: pelvic rest Diet: routine Medications: Iron and Percocet Condition: stable Instructions: refer to practice specific booklet Discharge to: home Follow-up Information    Brittney Millers, MD Follow up in 4 week(s).   Specialty:  Obstetrics and Gynecology Contact information: [place] [address] [phone]           Newborn Data: Live born female  Birth Weight: 6 lb 4.4 oz (2845 g) APGAR: 8, 9  Home with mother.  Mare Ludtke A 03/22/2017, 5:56 PM

## 2017-03-22 NOTE — Anesthesia Postprocedure Evaluation (Signed)
Anesthesia Post Note  Patient: Naomie Dean  Procedure(s) Performed: Procedure(s) (LRB): REPEAT CESAREAN SECTION (N/A)     Patient location during evaluation: Mother Baby Anesthesia Type: Epidural Level of consciousness: awake and alert and oriented Pain management: satisfactory to patient Vital Signs Assessment: post-procedure vital signs reviewed and stable Respiratory status: spontaneous breathing and nonlabored ventilation Cardiovascular status: stable Postop Assessment: no headache, no backache, patient able to bend at knees, no signs of nausea or vomiting and adequate PO intake Anesthetic complications: no    Last Vitals:  Vitals:   03/22/17 0336 03/22/17 0721  BP:  (!) 89/48  Pulse:  72  Resp: 18 16  Temp: 37.1 C 37 C    Last Pain:  Vitals:   03/22/17 0722  TempSrc:   PainSc: 2    Pain Goal: Patients Stated Pain Goal: 0 (03/21/17 1523)               Willa Rough

## 2017-03-22 NOTE — Progress Notes (Signed)
Subjective: Postpartum Day 1: Cesarean Delivery Patient reports incisional pain, tolerating PO and + flatus.    Objective: Vital signs in last 24 hours: Temp:  [96.2 F (35.7 C)-99 F (37.2 C)] 98.6 F (37 C) (06/13 0721) Pulse Rate:  [72-103] 72 (06/13 0721) Resp:  [16-24] 16 (06/13 0721) BP: (87-126)/(48-84) 89/48 (06/13 0721) SpO2:  [97 %-100 %] 97 % (06/13 0721) Weight:  [88.5 kg (195 lb)] 88.5 kg (195 lb) (06/12 1548)  Physical Exam:  General: alert, cooperative and no distress Lochia: appropriate Uterine Fundus: firm Incision: no dehiscence, no significant erythema, slight clear drainage present DVT Evaluation: No evidence of DVT seen on physical exam. Negative Homan's sign. No cords or calf tenderness.   Recent Labs  03/21/17 1536 03/22/17 0533  HGB 12.4 10.8*  HCT 37.7 32.8*    Assessment/Plan: Status post Cesarean section. Doing well postoperatively.  D/C foley Saline lock IV Patient may shower Will have honeycomb dressing changed Continue current post op care.  [place] Condon, [place] 03/22/2017, 9:41 AM

## 2017-03-22 NOTE — Anesthesia Postprocedure Evaluation (Signed)
Anesthesia Post Note  Patient: Naomie Dean  Procedure(s) Performed: Procedure(s) (LRB): REPEAT CESAREAN SECTION (N/A)     Patient location during evaluation: PACU Anesthesia Type: Epidural Level of consciousness: awake and alert Pain management: pain level controlled Vital Signs Assessment: post-procedure vital signs reviewed and stable Respiratory status: spontaneous breathing, nonlabored ventilation and respiratory function stable Cardiovascular status: stable and blood pressure returned to baseline Anesthetic complications: no    Last Vitals:  Vitals:   03/22/17 0336 03/22/17 0721  BP:  (!) 89/48  Pulse:  72  Resp: 18 16  Temp: 37.1 C 37 C    Last Pain:  Vitals:   03/22/17 0722  TempSrc:   PainSc: 2                  Lynda Rainwater

## 2017-03-22 NOTE — Addendum Note (Signed)
Addendum  created 03/22/17 0845 by Jonna Munro, CRNA   Sign clinical note

## 2017-03-22 NOTE — Op Note (Signed)
NAME:  Brittney Steele, Brittney Steele            ACCOUNT NO.:  [id number]  MEDICAL RECORD NO.:  LOCATION:                                 FACILITY:  PHYSICIAN:  Freda Munro, M.D.         DATE OF BIRTH:  DATE OF PROCEDURE:  03/21/2017 DATE OF DISCHARGE:                              OPERATIVE REPORT   PREOPERATIVE DIAGNOSES: 1. Intrauterine pregnancy at 38-6/7 weeks. 2. History of prior cesarean section x2. 3. Active labor.  POSTOPERATIVE DIAGNOSES: 1. Intrauterine pregnancy at 38-6/7 weeks. 2. History of prior cesarean section x2. 3. Active labor.  PROCEDURE:  Repeat low transverse cesarean section.  SURGEON:  Freda Munro, M.D.  ANESTHESIA:  Spinal.  ANTIBIOTICS:  Ancef 2 g.  DRAINS:  Foley, bedside drainage.  ESTIMATED BLOOD LOSS:  900 mL.  SPECIMENS:  None.  FINDINGS:  The patient had approximately 5-6 uterine fibroids, all subserosal, 1 in the lower right hand posteriorly and then the others along the fundus.  She also had extensive omental adhesions to the uterus and anterior abdominal wall, and the bladder was adherent to the anterior uterine wall approximately halfway up its body.  PROCEDURE IN DETAIL:  The patient was taken to the operating room where she was given a spinal anesthetic without difficulty.  She was then placed in dorsal supine position with a left lateral tilt.  She was prepped and draped in the usual fashion for this procedure.  A Foley catheter was placed in her bladder.  A Pfannenstiel incision was made through the previous scar.  The fascia was opened with the Mayo scissors and then separated from the muscles with the Bovie.  The rectus muscles were divided in the midline using scalpel.  There were dense adhesions in the lower portion of the incision.  Therefore, the abdominal cavity was entered up near the umbilicus and then the adhesions taken down along with the bladder until the lower uterine segment could be seen. Once this was accomplished,  a low transverse uterine incision was made with the Metzenbaum scissors.  The fluid was noted be clear.  The infant was delivered with the vacuum extractor.  On delivery of the head, the oropharynx and nostrils were bulb suctioned.  The remaining infant was then delivered.  The cord was doubly clamped and cut, and the infant handed to the awaiting NICU team.  Of note was that the past uterine scar was extremely thin.  The fetal hair could be seen through the thin layer of tissue.  The patient was advised of this and recommended no further pregnancies if possible.  Once the infant was handed to the waiting NICU team, the placenta was manually removed.  The uterus was exteriorized.  The uterine cavity was examined and wiped with wet lap. No evidence of any remaining products of conception was seen.  The uterine incision was closed in a single layer of 0 Monocryl suture in a running locking fashion.  The bladder flap was not repaired.  Hemostasis was felt to be adequate.  The uterus was placed back in the abdominal cavity, and the rectus muscles were reapproximated in the midline using 2-0 Monocryl suture in a running fashion.  The  fascia was closed using 0 Monocryl suture in a running fashion.  Subcuticular tissue was irrigated and made hemostatic with the Bovie.  Skin was closed with subcuticular 3- 0 Rapide, followed by Steri-Strips.  She was taken to the recovery room in stable condition.  Instrument and lap counts correct x3.          ______________________________ Freda Munro, M.D.     MA/MEDQ  D:  03/21/2017  T:  03/21/2017  Job:  [id number]

## 2017-03-23 MED ORDER — OXYCODONE-ACETAMINOPHEN 5-325 MG PO TABS: 1.0000 | ORAL_TABLET | ORAL | 0 refills | Status: DC | PRN

## 2017-03-23 NOTE — Lactation Note (Signed)
This note was copied from a baby's chart. Lactation Consultation Note  Patient Name: Brittney Steele JGGEZ'M Date: 03/23/2017 Reason for consult: Follow-up assessment Baby at 42 hr of life and dyad set for d/c today. Mom stated that latching is going well. She denies breast or nipple pain. She stated baby is calm at the breast and when she comes off she seems satisfied. She was told by the RN that baby had lost too much wt and she needed to start supplementing. She tried manual expression and DEBP but got "drops" so she was offered formula. She has a DEBP at home and knows how to use it. Discussed baby behavior, feeding frequency, baby belly size, supplementing, pumping, volume feeding guidelines, risks of formula, voids, wt loss, breast changes, and nipple care. Parents are aware of lactation services and support group. They will call as needed.  Mom will offer the breast on demand 8+/24hr and post pump.    Maternal Data    Feeding Feeding Type: Breast Fed  LATCH Score/Interventions Latch: Grasps breast easily, tongue down, lips flanged, rhythmical sucking.  Audible Swallowing: A few with stimulation Intervention(s): Skin to skin;Hand expression;Alternate breast massage  Type of Nipple: Everted at rest and after stimulation  Comfort (Breast/Nipple): Soft / non-tender     Hold (Positioning): No assistance needed to correctly position infant at breast. Intervention(s): Breastfeeding basics reviewed;Skin to skin  LATCH Score: 9  Lactation Tools Discussed/Used     Consult Status Consult Status: Complete Follow-up type: Call as needed    Denzil Hughes 03/23/2017, 10:59 AM

## 2017-09-20 ENCOUNTER — Ambulatory Visit: Payer: BLUE CROSS/BLUE SHIELD | Admitting: Internal Medicine

## 2017-09-20 ENCOUNTER — Encounter: Payer: Self-pay | Admitting: Internal Medicine

## 2017-09-20 VITALS — BP 108/76 | HR 74 | Temp 98.7°F | Wt 161.0 lb

## 2017-09-20 DIAGNOSIS — J3089 Other allergic rhinitis: Secondary | ICD-10-CM | POA: Diagnosis not present

## 2017-09-20 DIAGNOSIS — D239 Other benign neoplasm of skin, unspecified: Secondary | ICD-10-CM | POA: Diagnosis not present

## 2017-09-20 DIAGNOSIS — L700 Acne vulgaris: Secondary | ICD-10-CM

## 2017-09-20 MED ORDER — ALBUTEROL SULFATE HFA 108 (90 BASE) MCG/ACT IN AERS: 2.0000 | INHALATION_SPRAY | Freq: Four times a day (QID) | RESPIRATORY_TRACT | 0 refills | Status: DC | PRN

## 2017-09-20 NOTE — Patient Instructions (Signed)
Acne  Acne is a skin problem that causes small, red bumps (pimples). Acne happens when the tiny holes in your skin (pores) get blocked. Your pores may become red, sore, and swollen. They may also become infected. Acne is a common skin problem. It is especially common in teenagers. Acne usually goes away over time.  Follow these instructions at home:  Good skin care is the most important thing you can do to treat your acne. Take care of your skin as told by your doctor. You may be told to do these things:  · Wash your skin gently at least two times each day. You should also wash your skin:  ? After you exercise.  ? Before you go to bed.  · Use mild soap.  · Use a water-based skin moisturizer after you wash your skin.  · Use a sunscreen or sunblock with SPF 30 or greater. This is very important if you are using acne medicines.  · Choose cosmetics that will not plug your oil glands (are noncomedogenic).    Medicines  · Take over-the-counter and prescription medicines only as told by your doctor.  · If you were prescribed an antibiotic medicine, apply or take it as told by your doctor. Do not stop using the antibiotic even if your acne improves.  General instructions  · Keep your hair clean and off of your face. Shampoo your hair regularly. If you have oily hair, you may need to wash it every day.  · Avoid leaning your chin or forehead on your hands.  · Avoid wearing tight headbands or hats.  · Avoid picking or squeezing your pimples. That can make your acne worse and cause scarring.  · Keep all follow-up visits as told by your doctor. This is important.  · Shave gently. Only shave when it is necessary.  · Keep a food journal. This can help you to see if any foods are linked with your acne.  Contact a doctor if:  · Your acne is not better after eight weeks.  · Your acne gets worse.  · You have a large area of skin that is red or tender.  · You think that you are having side effects from any acne medicine.  This  information is not intended to replace advice given to you by your health care provider. Make sure you discuss any questions you have with your health care provider.  Document Released: 09/15/2011 Document Revised: 03/03/2016 Document Reviewed: 12/03/2014  Elsevier Interactive Patient Education © 2018 [employer] Inc[employer]

## 2017-09-20 NOTE — Progress Notes (Signed)
Subjective:    Patient ID: Brittney Steele, female    DOB: Nov 22, 1975, 41 y.o.   MRN: [id number]  HPI  Pt presents to the clinic today with multiple complaints.   1- She has a mole behind her left knee. She reports this mole has been there for many years. Recently, she noticed it has become hard and itchy. She has not noticed that it has gotten bigger in size. She has no family history of skin cancer or melanoma.  2- She reports intermittent shortness of breath. This started 6 months ago. She reports associated runny nose and watery eyes. She denies dizziness, visual changes or chest pain. She has noticed it is worse when the weather is hot outside, or she has the heat on in her house. She denies feeling anxious or overwhelmed. She reports when she gets like this, she feels like she has to drink water for her symptoms to resolve.  3- She also reports acne on face. This started after her last pregnancy. She has tried multiple OTC products with Benzyl Peroxide without any relief. She would like a referral to a dermatologist today.  Review of Systems      Past Medical History:  Diagnosis Date  . AMA (advanced maternal age) multigravida 27+   . Fibroid     Current Outpatient Medications  Medication Sig Dispense Refill  . Multiple Vitamin (MULTIVITAMIN) tablet Take 1 tablet by mouth daily.    Marland Kitchen albuterol (PROVENTIL HFA;VENTOLIN HFA) 108 (90 Base) MCG/ACT inhaler Inhale 2 puffs into the lungs every 6 (six) hours as needed for wheezing or shortness of breath. 1 Inhaler 0   No current facility-administered medications for this visit.     Allergies  Allergen Reactions  . Duramorph [Morphine] Itching    No family history on file.  Social History   Socioeconomic History  . Marital status: Married    Spouse name: Not on file  . Number of children: 2  . Years of education: Not on file  . Highest education level: Not on file  Social Needs  . Financial resource strain: Not on  file  . Food insecurity - worry: Not on file  . Food insecurity - inability: Not on file  . Transportation needs - medical: Not on file  . Transportation needs - non-medical: Not on file  Occupational History    Employer: [employer]  Tobacco Use  . Smoking status: Never Smoker  . Smokeless tobacco: Never Used  Substance and Sexual Activity  . Alcohol use: No    Alcohol/week: 0.0 oz  . Drug use: No  . Sexual activity: No  Other Topics Concern  . Not on file  Social History Narrative   Lives with husband and 2 daughters in [place]..Used to work in Science writer, now Probation officer      Regular exercise-yes     Constitutional: Denies fever, malaise, fatigue, headache or abrupt weight changes.  HEENT: Pt reports watery eyes, runny nose. Denies eye pain, eye redness, ear pain, ringing in the ears, wax buildup, nasal congestion, bloody nose, or sore throat. Respiratory: Pt reports shortness of breath. Denies difficulty breathing, cough or sputum production.   Cardiovascular: Denies chest pain, chest tightness, palpitations or swelling in the hands or feet.  Skin: Pt reports acne on face, skin lesion behind left knee. Denies redness, or ulcercations.    No other specific complaints in a complete review of systems (except as listed in HPI above).  Objective:   Physical  Exam  BP 108/76   Pulse 74   Temp 98.7 F (37.1 C) (Oral)   Wt 161 lb (73 kg)   LMP 09/17/2017   SpO2 98%   BMI 30.42 kg/m  Wt Readings from Last 3 Encounters:  09/20/17 161 lb (73 kg)  03/21/17 195 lb (88.5 kg)  10/19/15 147 lb (66.7 kg)    General: Appears her stated age, well developed, well nourished in NAD. Skin: Cystic acne noted on bilateral lower face. Dermatofibroma noted behind left knee. HEENT: Head: normal shape and size; Ears: Tm's gray and intact, normal light reflex; Nose: mucosa boggy and moist, turbinates swollen; Throat/Mouth: Teeth present, mucosa pink and moist,+ PND, no exudate,  lesions or ulcerations noted.  Neck:  Neck supple, trachea midline. No masses, lumps or thyromegaly present.  Cardiovascular: Normal rate and rhythm. S1,S2 noted.  No murmur, rubs or gallops noted.  Pulmonary/Chest: Normal effort and positive vesicular breath sounds. No respiratory distress. No wheezes, rales or ronchi noted.   BMET    Component Value Date/Time   NA 143 06/16/2015 0829   K 4.0 06/16/2015 0829   CL 107 06/16/2015 0829   CO2 29 06/16/2015 0829   GLUCOSE 79 06/16/2015 0829   BUN 11 06/16/2015 0829   CREATININE 0.78 06/16/2015 0829   CALCIUM 9.1 06/16/2015 0829   GFRNONAA 126.25 10/06/2010 0819    Lipid Panel     Component Value Date/Time   CHOL 151 06/16/2015 0829   TRIG 67.0 06/16/2015 0829   HDL 48.80 06/16/2015 0829   CHOLHDL 3 06/16/2015 0829   VLDL 13.4 06/16/2015 0829   LDLCALC 89 06/16/2015 0829    CBC    Component Value Date/Time   WBC 10.4 03/22/2017 0533   RBC 3.74 (L) 03/22/2017 0533   HGB 10.8 (L) 03/22/2017 0533   HCT 32.8 (L) 03/22/2017 0533   PLT 207 03/22/2017 0533   MCV 87.7 03/22/2017 0533   MCH 28.9 03/22/2017 0533   MCHC 32.9 03/22/2017 0533   RDW 14.5 03/22/2017 0533   LYMPHSABS 2.8 10/06/2010 0819   MONOABS 0.5 10/06/2010 0819   EOSABS 0.0 10/06/2010 0819   BASOSABS 0.0 10/06/2010 0819    Hgb A1C Lab Results  Component Value Date   HGBA1C 5.2 06/16/2015            Assessment & Plan:   Allergic Rhinitis:  I wonder if she is having some mild RAD secondary to uncontrolled allergies She does not want to take an antihistamine as she is currently breastfeeding eRx for Albuterol inhaler every 4-6 hours as needed  Dermatofibroma:  Benign Advised her no intervention is needed at this time Will monitor  Cystic Acne:  Advised her we could try Minocycline or Spironolactone when she is no longer breastfeeding Can consider Retin A in the meantime, but she wants to hold off for now  Return precautions  discussed Webb Silversmith, NP

## 2017-10-18 ENCOUNTER — Other Ambulatory Visit: Payer: Self-pay | Admitting: Internal Medicine

## 2017-10-18 DIAGNOSIS — J4520 Mild intermittent asthma, uncomplicated: Secondary | ICD-10-CM

## 2017-10-18 DIAGNOSIS — J45909 Unspecified asthma, uncomplicated: Secondary | ICD-10-CM | POA: Insufficient documentation

## 2017-10-18 NOTE — Telephone Encounter (Signed)
Please advise if refill is appropriate... I do not see Dx of asthma or allergy induced.Marland KitchenMarland KitchenMarland Kitchen

## 2017-10-18 NOTE — Telephone Encounter (Signed)
Refilled Proair. Added allergy induced asthma to problem list.

## 2017-10-25 ENCOUNTER — Encounter: Payer: Self-pay | Admitting: Internal Medicine

## 2017-10-25 ENCOUNTER — Ambulatory Visit (INDEPENDENT_AMBULATORY_CARE_PROVIDER_SITE_OTHER): Payer: BLUE CROSS/BLUE SHIELD | Admitting: Internal Medicine

## 2017-10-25 VITALS — BP 106/80 | HR 61 | Temp 98.3°F | Ht 61.0 in | Wt 157.0 lb

## 2017-10-25 DIAGNOSIS — Z0000 Encounter for general adult medical examination without abnormal findings: Secondary | ICD-10-CM

## 2017-10-25 DIAGNOSIS — J4520 Mild intermittent asthma, uncomplicated: Secondary | ICD-10-CM | POA: Diagnosis not present

## 2017-10-25 DIAGNOSIS — Z23 Encounter for immunization: Secondary | ICD-10-CM

## 2017-10-25 DIAGNOSIS — L709 Acne, unspecified: Secondary | ICD-10-CM

## 2017-10-25 LAB — CBC
HEMATOCRIT: 39.1 % (ref 36.0–46.0)
HEMOGLOBIN: 12.5 g/dL (ref 12.0–15.0)
MCHC: 31.9 g/dL (ref 30.0–36.0)
MCV: 86.3 fl (ref 78.0–100.0)
PLATELETS: 266 10*3/uL (ref 150.0–400.0)
RBC: 4.53 Mil/uL (ref 3.87–5.11)
RDW: 13 % (ref 11.5–15.5)
WBC: 6.2 10*3/uL (ref 4.0–10.5)

## 2017-10-25 LAB — LIPID PANEL
CHOLESTEROL: 173 mg/dL (ref 0–200)
HDL: 75.2 mg/dL (ref >39.00)
LDL CALC: 83 mg/dL (ref 0–99)
NonHDL: 98.22
TRIGLYCERIDES: 76 mg/dL (ref 0.0–149.0)
Total CHOL/HDL Ratio: 2
VLDL: 15.2 mg/dL (ref 0.0–40.0)

## 2017-10-25 LAB — COMPREHENSIVE METABOLIC PANEL
ALBUMIN: 4.4 g/dL (ref 3.5–5.2)
ALT: 13 U/L (ref 0–35)
AST: 17 U/L (ref 0–37)
Alkaline Phosphatase: 76 U/L (ref 39–117)
BUN: 9 mg/dL (ref 6–23)
CALCIUM: 9.4 mg/dL (ref 8.4–10.5)
CHLORIDE: 102 meq/L (ref 96–112)
CO2: 30 mEq/L (ref 19–32)
CREATININE: 0.76 mg/dL (ref 0.40–1.20)
GFR: 108.54 mL/min (ref >60.00)
Glucose, Bld: 87 mg/dL (ref 70–99)
Potassium: 4 mEq/L (ref 3.5–5.1)
Sodium: 138 mEq/L (ref 135–145)
Total Bilirubin: 0.3 mg/dL (ref 0.2–1.2)
Total Protein: 7.3 g/dL (ref 6.0–8.3)

## 2017-10-25 MED ORDER — CLINDAMYCIN PHOS-BENZOYL PEROX 1-5 % EX GEL: Freq: Two times a day (BID) | CUTANEOUS | 0 refills | Status: DC

## 2017-10-25 NOTE — Patient Instructions (Signed)

## 2017-10-25 NOTE — Progress Notes (Signed)
Subjective:    Patient ID: Brittney Steele, female    DOB: [birth year], 42 y.o.   MRN: [id number]  HPI  Pt presents to the clinic today for her annual exam.  Allergy Induced Asthma: Worse in the spring and fall. She will take an antihistamine OTC when needed. She uses Albuterol as needed with good relief.  Flu: 06/2015 Tetanus: 12/2016 Pap Smear: 04/2016 Dentist: [facility]  Diet: She does eat meat. She consumes fruits and veggies daily. She occasionally eats fried foods. She drinks mostly water. Exercise: None  Review of Systems      Past Medical History:  Diagnosis Date  . AMA (advanced maternal age) multigravida 50+   . Fibroid     Current Outpatient Medications  Medication Sig Dispense Refill  . albuterol (PROVENTIL HFA;VENTOLIN HFA) 108 (90 Base) MCG/ACT inhaler TAKE 2 PUFFS BY MOUTH EVERY 6 HOURS AS NEEDED FOR WHEEZE OR SHORTNESS OF BREATH 8.5 Inhaler 0  . Multiple Vitamin (MULTIVITAMIN) tablet Take 1 tablet by mouth daily.     No current facility-administered medications for this visit.     Allergies  Allergen Reactions  . Duramorph [Morphine] Itching    No family history on file.  Social History   Socioeconomic History  . Marital status: Married    Spouse name: Not on file  . Number of children: 2  . Years of education: Not on file  . Highest education level: Not on file  Social Needs  . Financial resource strain: Not on file  . Food insecurity - worry: Not on file  . Food insecurity - inability: Not on file  . Transportation needs - medical: Not on file  . Transportation needs - non-medical: Not on file  Occupational History    Employer: [employer]  Tobacco Use  . Smoking status: Never Smoker  . Smokeless tobacco: Never Used  Substance and Sexual Activity  . Alcohol use: No    Alcohol/week: 0.0 oz  . Drug use: No  . Sexual activity: No  Other Topics Concern  . Not on file  Social History Narrative   Lives with husband and 2  daughters in [place]..Used to work in Science writer, now Probation officer      Regular exercise-yes     Constitutional: Denies fever, malaise, fatigue, headache or abrupt weight changes.  HEENT: Denies eye pain, eye redness, ear pain, ringing in the ears, wax buildup, runny nose, nasal congestion, bloody nose, or sore throat. Respiratory: Denies difficulty breathing, shortness of breath, cough or sputum production.   Cardiovascular: Denies chest pain, chest tightness, palpitations or swelling in the hands or feet.  Gastrointestinal: Denies abdominal pain, bloating, constipation, diarrhea or blood in the stool.  GU: Denies urgency, frequency, pain with urination, burning sensation, blood in urine, odor or discharge. Musculoskeletal: Denies decrease in range of motion, difficulty with gait, muscle pain or joint pain and swelling.  Skin: Pt reports acne. Neurological: Denies dizziness, difficulty with memory, difficulty with speech or problems with balance and coordination.  Psych: Denies anxiety, depression, SI/HI.  No other specific complaints in a complete review of systems (except as listed in HPI above).  Objective:   Physical Exam  BP 106/80   Pulse 61   Temp 98.3 F (36.8 C) (Oral)   Ht 5\' 1"  (1.549 m)   Wt 157 lb (71.2 kg)   LMP 09/25/2017   SpO2 99%   BMI 29.66 kg/m  Wt Readings from Last 3 Encounters:  10/25/17 157 lb (71.2 kg)  09/20/17 161 lb (73 kg)  03/21/17 195 lb (88.5 kg)    General: Appears her stated age, well developed, well nourished in NAD. Skin: Warm, dry and intact. Acne noted on face. HEENT: Head: normal shape and size; Eyes: sclera white, no icterus, conjunctiva pink, PERRLA and EOMs intact; Ears: Tm's gray and intact, normal light reflex; Throat/Mouth: Teeth present, mucosa pink and moist, no exudate, lesions or ulcerations noted.  Neck:  Neck supple, trachea midline. No masses, lumps or thyromegaly present.  Cardiovascular: Normal rate and rhythm. S1,S2  noted.  No murmur, rubs or gallops noted. No JVD or BLE edema.  Pulmonary/Chest: Normal effort and positive vesicular breath sounds. No respiratory distress. No wheezes, rales or ronchi noted.  Abdomen: Soft and nontender. Normal bowel sounds. No distention or masses noted. Liver, spleen and kidneys non palpable. Musculoskeletal: Strength 5/5 BUE/BLE. No difficulty with gait.  Neurological: Alert and oriented. Cranial nerves II-XII grossly intact. Coordination normal.  Psychiatric: Mood and affect normal. Behavior is normal. Judgment and thought content normal.    BMET    Component Value Date/Time   NA 143 06/16/2015 0829   K 4.0 06/16/2015 0829   CL 107 06/16/2015 0829   CO2 29 06/16/2015 0829   GLUCOSE 79 06/16/2015 0829   BUN 11 06/16/2015 0829   CREATININE 0.78 06/16/2015 0829   CALCIUM 9.1 06/16/2015 0829   GFRNONAA 126.25 10/06/2010 0819    Lipid Panel     Component Value Date/Time   CHOL 151 06/16/2015 0829   TRIG 67.0 06/16/2015 0829   HDL 48.80 06/16/2015 0829   CHOLHDL 3 06/16/2015 0829   VLDL 13.4 06/16/2015 0829   LDLCALC 89 06/16/2015 0829    CBC    Component Value Date/Time   WBC 10.4 03/22/2017 0533   RBC 3.74 (L) 03/22/2017 0533   HGB 10.8 (L) 03/22/2017 0533   HCT 32.8 (L) 03/22/2017 0533   PLT 207 03/22/2017 0533   MCV 87.7 03/22/2017 0533   MCH 28.9 03/22/2017 0533   MCHC 32.9 03/22/2017 0533   RDW 14.5 03/22/2017 0533   LYMPHSABS 2.8 10/06/2010 0819   MONOABS 0.5 10/06/2010 0819   EOSABS 0.0 10/06/2010 0819   BASOSABS 0.0 10/06/2010 0819    Hgb A1C Lab Results  Component Value Date   HGBA1C 5.2 06/16/2015            Assessment & Plan:   Preventative Health Maintenance:  Flu shot today Tetanus UTD Mammogram ordered, she will call [facility] schedule Pap smear UTD Encouraged her to consume a balanced diet and exercise regimen Advised her to see an eye doctor and dentist annually Will check CBC< CMET, Lipid profile  today  Acne:  eRx for Benzaclin- use as directed  RTC in 1 year, sooner if needed Webb Silversmith, NP

## 2017-10-25 NOTE — Assessment & Plan Note (Signed)
Controlled with antihistamine and Albuterol

## 2017-10-25 NOTE — Addendum Note (Signed)
Addended by: Lurlean Nanny on: 10/25/2017 05:14 PM   Modules accepted: Orders

## 2017-12-07 DIAGNOSIS — Z6829 Body mass index (BMI) 29.0-29.9, adult: Secondary | ICD-10-CM | POA: Diagnosis not present

## 2017-12-07 DIAGNOSIS — Z124 Encounter for screening for malignant neoplasm of cervix: Secondary | ICD-10-CM | POA: Diagnosis not present

## 2017-12-07 DIAGNOSIS — Z01419 Encounter for gynecological examination (general) (routine) without abnormal findings: Secondary | ICD-10-CM | POA: Diagnosis not present

## 2018-04-19 ENCOUNTER — Telehealth: Payer: Self-pay

## 2018-04-19 NOTE — Telephone Encounter (Signed)
Patient came by and dropped off a form to be filled out for work. Patient was seen for CPE in January 2019. Patient does have an appointment already to get TB test done on 05/02/18 in order to complete the form completely. CB :[phone] . Thank Edrick Kins, RMA

## 2018-04-20 NOTE — Telephone Encounter (Signed)
Form completed, placed in MYD

## 2018-04-24 NOTE — Telephone Encounter (Signed)
Placed in my "on hold" folder

## 2018-04-30 ENCOUNTER — Telehealth: Payer: Self-pay

## 2018-04-30 NOTE — Telephone Encounter (Signed)
Copied from [facility] [id number]. Topic: Inquiry >> Apr 30, 2018  9:51 AM Nils Flack wrote: Reason for CRM: pt has form ready, she would like for husband Brittney Steele to pick up. He has appt at 1030

## 2018-04-30 NOTE — Telephone Encounter (Signed)
See 04/19/18 phonenote.

## 2018-05-02 ENCOUNTER — Ambulatory Visit: Payer: BLUE CROSS/BLUE SHIELD

## 2018-05-03 NOTE — Telephone Encounter (Signed)
Form had been placed in front office for pick up

## 2018-07-10 ENCOUNTER — Ambulatory Visit: Payer: BLUE CROSS/BLUE SHIELD

## 2018-07-26 ENCOUNTER — Ambulatory Visit (INDEPENDENT_AMBULATORY_CARE_PROVIDER_SITE_OTHER): Payer: BLUE CROSS/BLUE SHIELD

## 2018-07-26 DIAGNOSIS — Z23 Encounter for immunization: Secondary | ICD-10-CM | POA: Diagnosis not present

## 2019-03-01 ENCOUNTER — Ambulatory Visit (INDEPENDENT_AMBULATORY_CARE_PROVIDER_SITE_OTHER): Payer: BLUE CROSS/BLUE SHIELD

## 2019-03-01 ENCOUNTER — Other Ambulatory Visit: Payer: Self-pay

## 2019-03-01 ENCOUNTER — Ambulatory Visit: Payer: BLUE CROSS/BLUE SHIELD | Admitting: Podiatry

## 2019-03-01 ENCOUNTER — Encounter: Payer: Self-pay | Admitting: Podiatry

## 2019-03-01 VITALS — BP 97/71 | HR 86 | Temp 98.1°F | Resp 16

## 2019-03-01 DIAGNOSIS — G5761 Lesion of plantar nerve, right lower limb: Secondary | ICD-10-CM

## 2019-03-01 DIAGNOSIS — G5762 Lesion of plantar nerve, left lower limb: Secondary | ICD-10-CM

## 2019-03-01 DIAGNOSIS — M779 Enthesopathy, unspecified: Secondary | ICD-10-CM | POA: Diagnosis not present

## 2019-03-01 DIAGNOSIS — M7742 Metatarsalgia, left foot: Secondary | ICD-10-CM

## 2019-03-01 DIAGNOSIS — M7741 Metatarsalgia, right foot: Secondary | ICD-10-CM

## 2019-03-01 DIAGNOSIS — M778 Other enthesopathies, not elsewhere classified: Secondary | ICD-10-CM

## 2019-03-01 NOTE — Patient Instructions (Signed)

## 2019-03-01 NOTE — Progress Notes (Signed)
  Subjective:  Patient ID: Brittney Steele, female    DOB: 06/20/1976,  MRN: [id number]  Chief Complaint  Patient presents with  . Foot Pain    Plantar bilateral "whole bottom" - pins and needle sensations x few months, started in right foot, getting more intense, tried taking magnesium, multivitamins, increased exercise-no helping  . New Patient (Initial Visit)    43 y.o. female presents with the above complaint. Hx above confirmed with patient.  Review of Systems: Negative except as noted in the HPI. Denies N/V/F/Ch.  Past Medical History:  Diagnosis Date  . AMA (advanced maternal age) multigravida 35+   . Fibroid     Current Outpatient Medications:  Marland Kitchen  MAGNESIUM GLUCONATE PO, Take by mouth., Disp: , Rfl:  .  Ascorbic Acid (VITAMIN C) 1000 MG tablet, Take 1,000 mg by mouth daily., Disp: , Rfl:  .  Multiple Vitamin (MULTIVITAMIN) tablet, Take 1 tablet by mouth daily., Disp: , Rfl:  .  Zinc Sulfate (ZINC 15 PO), Take by mouth., Disp: , Rfl:   Social History   Tobacco Use  Smoking Status Never Smoker  Smokeless Tobacco Never Used    Allergies  Allergen Reactions  . Duramorph [Morphine] Itching   Objective:   Vitals:   03/01/19 1114  BP: 97/71  Pulse: 86  Resp: 16  Temp: 98.1 F (36.7 C)   There is no height or weight on file to calculate BMI. Constitutional Well developed. Well nourished.  Vascular Dorsalis pedis pulses palpable bilaterally. Posterior tibial pulses palpable bilaterally. Capillary refill normal to all digits.  No cyanosis or clubbing noted. Pedal hair growth normal.  Neurologic Normal speech. Oriented to person, place, and time. Epicritic sensation to light touch grossly present bilaterally.  Dermatologic Nails well groomed and normal in appearance. No open wounds. No skin lesions.  Orthopedic: POP third interspace bilaterally with Mulder's click   Radiographs: Taken and reviewed  Assessment:   1. Morton neuroma, left   2. Morton  neuroma, right   3. Metatarsalgia of both feet    Plan:  Patient was evaluated and treated and all questions answered.  Interdigital Neuroma, bilaterally -Educated on etiology -Interspace injection delivered as below. -Educated on padding and proper shoegear -We will discuss custom molded orthotics at next visit  Procedure: Neuroma Injection Location: Bilateral 3rd interspace Skin Prep: Alcohol. Injectate: 0.5 cc 0.5% marcaine plain, 0.5 cc dexamethasone phosphate. Disposition: Patient tolerated procedure well. Injection site dressed with a band-aid.  Return in about 3 weeks (around 03/22/2019) for Neuroma, Bilateral.

## 2019-03-07 ENCOUNTER — Ambulatory Visit (INDEPENDENT_AMBULATORY_CARE_PROVIDER_SITE_OTHER): Payer: BLUE CROSS/BLUE SHIELD | Admitting: Internal Medicine

## 2019-03-07 ENCOUNTER — Encounter: Payer: Self-pay | Admitting: Internal Medicine

## 2019-03-07 DIAGNOSIS — R202 Paresthesia of skin: Secondary | ICD-10-CM | POA: Diagnosis not present

## 2019-03-07 DIAGNOSIS — Z0000 Encounter for general adult medical examination without abnormal findings: Secondary | ICD-10-CM | POA: Diagnosis not present

## 2019-03-07 NOTE — Progress Notes (Signed)
Virtual Visit via Video Note  I connected with Brittney Steele on 03/07/19 at  2:30 PM EDT by a video enabled telemedicine application and verified that I am speaking with the correct person using two identifiers.  Location: Patient: Home Provider: Office   I discussed the limitations of evaluation and management by telemedicine and the availability of in person appointments. The patient expressed understanding and agreed to proceed.  History of Present Illness:  Pt due for her annual exam.  She does c/o intermittent numbness and tingling. This started 1-2 months ago. It seems to be more frequent. It happens at different times in different places in her upper and lower extremities. She denies weakness. She denies any pattern or inciting events. She has not tried anything OTC for this.   Flu: 07/2018 Tetanus: 12/2016 Pap Smear: 11/2017 Mammogram: never, but scheduled Vision Screening: yearly Dentist: biannually  Diet: She does eat meat. She consumes fruits and veggies. She does not eat fried foods. She drinks mostly water. Exercise: Walking   Past Medical History:  Diagnosis Date  . AMA (advanced maternal age) multigravida 18+   . Fibroid     Current Outpatient Medications  Medication Sig Dispense Refill  . Ascorbic Acid (VITAMIN C) 1000 MG tablet Take 1,000 mg by mouth daily.    Marland Kitchen MAGNESIUM GLUCONATE PO Take by mouth.    . Multiple Vitamin (MULTIVITAMIN) tablet Take 1 tablet by mouth daily.    . Zinc Sulfate (ZINC 15 PO) Take by mouth.     No current facility-administered medications for this visit.     Allergies  Allergen Reactions  . Duramorph [Morphine] Itching    History reviewed. No pertinent family history.  Social History   Socioeconomic History  . Marital status: Married    Spouse name: Not on file  . Number of children: 2  . Years of education: Not on file  . Highest education level: Not on file  Occupational History    Employer: [employer]   Social Needs  . Financial resource strain: Not on file  . Food insecurity:    Worry: Not on file    Inability: Not on file  . Transportation needs:    Medical: Not on file    Non-medical: Not on file  Tobacco Use  . Smoking status: Never Smoker  . Smokeless tobacco: Never Used  Substance and Sexual Activity  . Alcohol use: No    Alcohol/week: 0.0 standard drinks  . Drug use: No  . Sexual activity: Never  Lifestyle  . Physical activity:    Days per week: Not on file    Minutes per session: Not on file  . Stress: Not on file  Relationships  . Social connections:    Talks on phone: Not on file    Gets together: Not on file    Attends religious service: Not on file    Active member of club or organization: Not on file    Attends meetings of clubs or organizations: Not on file    Relationship status: Not on file  . Intimate partner violence:    Fear of current or ex partner: Not on file    Emotionally abused: Not on file    Physically abused: Not on file    Forced sexual activity: Not on file  Other Topics Concern  . Not on file  Social History Narrative   Lives with husband and 2 daughters in [place]..Used to work in Science writer, now Probation officer  Regular exercise-yes     Constitutional: Denies fever, malaise, fatigue, headache or abrupt weight changes.  HEENT: Denies eye pain, eye redness, ear pain, ringing in the ears, wax buildup, runny nose, nasal congestion, bloody nose, or sore throat. Respiratory: Denies difficulty breathing, shortness of breath, cough or sputum production.   Cardiovascular: Denies chest pain, chest tightness, palpitations or swelling in the hands or feet.  Gastrointestinal: Denies abdominal pain, bloating, constipation, diarrhea or blood in the stool.  GU: Denies urgency, frequency, pain with urination, burning sensation, blood in urine, odor or discharge. Musculoskeletal: Denies decrease in range of motion, difficulty with gait, muscle  pain or joint pain and swelling.  Skin: Denies redness, rashes, lesions or ulcercations.  Neurological: Pt reports intermittent numbness and tingling in upper and lower extremities. Denies dizziness, difficulty with memory, difficulty with speech or problems with balance and coordination.  Psych: Denies anxiety, depression, SI/HI.  No other specific complaints in a complete review of systems (except as listed in HPI above).  Wt Readings from Last 3 Encounters:  10/25/17 157 lb (71.2 kg)  09/20/17 161 lb (73 kg)  03/21/17 195 lb (88.5 kg)    General: Appears her stated age, well developed, well nourished in NAD. Skin: Warm, dry and intact.  HEENT: Head: normal shape and size; Eyes: sclera white, EOMs intact;  Pulmonary/Chest: Normal effort. No respiratory distress.  Neurological: Alert and oriented. . Coordination normal.  Psychiatric: Mood and affect normal. Behavior is normal. Judgment and thought content normal.     BMET    Component Value Date/Time   NA 138 10/25/2017 1531   K 4.0 10/25/2017 1531   CL 102 10/25/2017 1531   CO2 30 10/25/2017 1531   GLUCOSE 87 10/25/2017 1531   BUN 9 10/25/2017 1531   CREATININE 0.76 10/25/2017 1531   CALCIUM 9.4 10/25/2017 1531   GFRNONAA 126.25 10/06/2010 0819    Lipid Panel     Component Value Date/Time   CHOL 173 10/25/2017 1531   TRIG 76.0 10/25/2017 1531   HDL 75.20 10/25/2017 1531   CHOLHDL 2 10/25/2017 1531   VLDL 15.2 10/25/2017 1531   LDLCALC 83 10/25/2017 1531    CBC    Component Value Date/Time   WBC 6.2 10/25/2017 1531   RBC 4.53 10/25/2017 1531   HGB 12.5 10/25/2017 1531   HCT 39.1 10/25/2017 1531   PLT 266.0 10/25/2017 1531   MCV 86.3 10/25/2017 1531   MCH 28.9 03/22/2017 0533   MCHC 31.9 10/25/2017 1531   RDW 13.0 10/25/2017 1531   LYMPHSABS 2.8 10/06/2010 0819   MONOABS 0.5 10/06/2010 0819   EOSABS 0.0 10/06/2010 0819   BASOSABS 0.0 10/06/2010 0819    Hgb A1C Lab Results  Component Value Date    HGBA1C 5.2 06/16/2015        Assessment and Plan:  Preventative Health Maintenance:  Flu and tetanus UTD Pap smear UTD Mammogram scheduled Encouraged her to consume a balanced diet and exercise regimen Advised her to see an eye doctor and dentist annually Will check CBC, CMET, Lipid, A1C today  Paresthesia of Upper and Lower Extremities:  Will check CBC, CMET, TSH, B12, Vit D and Folate  Will follow up after labs are back, return precautions discussed  Follow Up Instructions:    I discussed the assessment and treatment plan with the patient. The patient was provided an opportunity to ask questions and all were answered. The patient agreed with the plan and demonstrated an understanding of the instructions.  The patient was advised to call back or seek an in-person evaluation if the symptoms worsen or if the condition fails to improve as anticipated.     Webb Silversmith, NP

## 2019-03-07 NOTE — Patient Instructions (Signed)

## 2019-03-08 ENCOUNTER — Other Ambulatory Visit (INDEPENDENT_AMBULATORY_CARE_PROVIDER_SITE_OTHER): Payer: BLUE CROSS/BLUE SHIELD

## 2019-03-08 DIAGNOSIS — R202 Paresthesia of skin: Secondary | ICD-10-CM

## 2019-03-08 LAB — LIPID PANEL
Cholesterol: 155 mg/dL (ref 0–200)
HDL: 59.4 mg/dL (ref >39.00)
LDL Cholesterol: 79 mg/dL (ref 0–99)
NonHDL: 95.69
Total CHOL/HDL Ratio: 3
Triglycerides: 81 mg/dL (ref 0.0–149.0)
VLDL: 16.2 mg/dL (ref 0.0–40.0)

## 2019-03-08 LAB — CBC
HCT: 36.9 % (ref 36.0–46.0)
Hemoglobin: 12.3 g/dL (ref 12.0–15.0)
MCHC: 33.4 g/dL (ref 30.0–36.0)
MCV: 82.5 fl (ref 78.0–100.0)
Platelets: 242 10*3/uL (ref 150.0–400.0)
RBC: 4.47 Mil/uL (ref 3.87–5.11)
RDW: 12.9 % (ref 11.5–15.5)
WBC: 7.1 10*3/uL (ref 4.0–10.5)

## 2019-03-08 LAB — COMPREHENSIVE METABOLIC PANEL
ALT: 10 U/L (ref 0–35)
AST: 14 U/L (ref 0–37)
Albumin: 4.1 g/dL (ref 3.5–5.2)
Alkaline Phosphatase: 48 U/L (ref 39–117)
BUN: 11 mg/dL (ref 6–23)
CO2: 25 mEq/L (ref 19–32)
Calcium: 9.1 mg/dL (ref 8.4–10.5)
Chloride: 103 mEq/L (ref 96–112)
Creatinine, Ser: 0.86 mg/dL (ref 0.40–1.20)
GFR: 87.94 mL/min (ref >60.00)
Glucose, Bld: 82 mg/dL (ref 70–99)
Potassium: 3.9 mEq/L (ref 3.5–5.1)
Sodium: 137 mEq/L (ref 135–145)
Total Bilirubin: 0.4 mg/dL (ref 0.2–1.2)
Total Protein: 7 g/dL (ref 6.0–8.3)

## 2019-03-08 LAB — HEMOGLOBIN A1C: Hgb A1c MFr Bld: 5.4 % (ref 4.6–6.5)

## 2019-03-08 LAB — VITAMIN D 25 HYDROXY (VIT D DEFICIENCY, FRACTURES): VITD: 21.46 ng/mL — ABNORMAL LOW (ref 30.00–100.00)

## 2019-03-08 LAB — TSH: TSH: 3.1 u[IU]/mL (ref 0.35–4.50)

## 2019-03-08 LAB — FOLATE: Folate: 20.1 ng/mL (ref >5.9)

## 2019-03-08 LAB — VITAMIN B12: Vitamin B-12: 280 pg/mL (ref 211–911)

## 2019-03-19 DIAGNOSIS — Z6828 Body mass index (BMI) 28.0-28.9, adult: Secondary | ICD-10-CM | POA: Diagnosis not present

## 2019-03-19 DIAGNOSIS — Z1231 Encounter for screening mammogram for malignant neoplasm of breast: Secondary | ICD-10-CM | POA: Diagnosis not present

## 2019-03-19 DIAGNOSIS — Z124 Encounter for screening for malignant neoplasm of cervix: Secondary | ICD-10-CM | POA: Diagnosis not present

## 2019-03-19 DIAGNOSIS — Z01419 Encounter for gynecological examination (general) (routine) without abnormal findings: Secondary | ICD-10-CM | POA: Diagnosis not present

## 2019-03-21 ENCOUNTER — Ambulatory Visit: Payer: BC Managed Care – PPO | Admitting: Podiatry

## 2019-03-21 ENCOUNTER — Encounter: Payer: Self-pay | Admitting: Podiatry

## 2019-03-21 ENCOUNTER — Other Ambulatory Visit: Payer: Self-pay

## 2019-03-21 VITALS — Temp 98.0°F

## 2019-03-21 DIAGNOSIS — M7741 Metatarsalgia, right foot: Secondary | ICD-10-CM

## 2019-03-21 DIAGNOSIS — M7742 Metatarsalgia, left foot: Secondary | ICD-10-CM

## 2019-03-21 DIAGNOSIS — G5762 Lesion of plantar nerve, left lower limb: Secondary | ICD-10-CM | POA: Diagnosis not present

## 2019-03-21 DIAGNOSIS — G5761 Lesion of plantar nerve, right lower limb: Secondary | ICD-10-CM

## 2019-03-22 ENCOUNTER — Ambulatory Visit: Payer: BLUE CROSS/BLUE SHIELD | Admitting: Podiatry

## 2019-04-08 NOTE — Progress Notes (Signed)
  Subjective:  Patient ID: Brittney Steele, female    DOB: Jun 13, 1976,  MRN: [id number]  Chief Complaint  Patient presents with  . Neuroma    3 week bil neuroma f/u pt states that she is not feeling any better, and that the injection has not helped as well, pt is looking to get an MRI    43 y.o. female presents with the above complaint. Hx above confirmed with patient.  Concerned that she is also having numbness of her hands and that has something neurological going on.  Review of Systems: Negative except as noted in the HPI. Denies N/V/F/Ch.  Past Medical History:  Diagnosis Date  . AMA (advanced maternal age) multigravida 2+   . Fibroid     Current Outpatient Medications:  .  Ascorbic Acid (VITAMIN C) 1000 MG tablet, Take 1,000 mg by mouth daily., Disp: , Rfl:  .  MAGNESIUM GLUCONATE PO, Take by mouth., Disp: , Rfl:  .  Multiple Vitamin (MULTIVITAMIN) tablet, Take 1 tablet by mouth daily., Disp: , Rfl:  .  Zinc Sulfate (ZINC 15 PO), Take by mouth., Disp: , Rfl:   Social History   Tobacco Use  Smoking Status Never Smoker  Smokeless Tobacco Never Used    Allergies  Allergen Reactions  . Duramorph [Morphine] Itching   Objective:   Vitals:   03/21/19 1317  Temp: 98 F (36.7 C)   There is no height or weight on file to calculate BMI. Constitutional Well developed. Well nourished.  Vascular Dorsalis pedis pulses palpable bilaterally. Posterior tibial pulses palpable bilaterally. Capillary refill normal to all digits.  No cyanosis or clubbing noted. Pedal hair growth normal.  Neurologic Normal speech. Oriented to person, place, and time. Epicritic sensation to light touch grossly present bilaterally.  Dermatologic Nails well groomed and normal in appearance. No open wounds. No skin lesions.  Orthopedic: POP third interspace bilaterally with Mulder's click   Radiographs: Taken and reviewed  Assessment:   1. Morton neuroma, left   2. Morton neuroma, right    3. Metatarsalgia of both feet    Plan:  Patient was evaluated and treated and all questions answered.  Interdigital Neuroma, bilaterally -Discussed repeat injection, patient declined. -Discussed with patient other options patient would like to consider MRI.  Discussed an MRI on this and the show something at this point.  Patient is concerned she has not been developed neurologically Advised to follow-up with her PCP and discuss possible referral to neurology.  I did discuss that the signs in her foot are concerning for local issue however if she is having pain in her hands as well she should follow-up with her PCP for further discussion.  We will have patient follow-up in 2 months for further discussion if she should wish to pursue further injection therapy or other local therapy for the neuromas.  Return in about 2 months (around 05/21/2019) for Neuroma f/u .

## 2019-05-01 ENCOUNTER — Telehealth: Payer: Self-pay | Admitting: Internal Medicine

## 2019-05-01 NOTE — Telephone Encounter (Signed)
Patient was seen for tingling in both feet.  Rollene Fare told patient if it continued she could be referred for an MRI.  Patient said she's waking up with numbness in her feet and tingling.  Patient said it's sporadic.  Patient said she is interested in being referred for an MRI or to a neurologist. Whichever Rollene Fare suggests.  Patient prefers [facility].  Patient can go anytime.

## 2019-05-03 ENCOUNTER — Other Ambulatory Visit: Payer: Self-pay | Admitting: Internal Medicine

## 2019-05-03 DIAGNOSIS — R202 Paresthesia of skin: Secondary | ICD-10-CM

## 2019-05-03 NOTE — Telephone Encounter (Signed)
Pt is aware and will come in next week for the xray

## 2019-05-03 NOTE — Telephone Encounter (Signed)
I had recommended xray of lumbar spine if numbness continues.  Will order this for now and she can come and get it anytime.

## 2019-05-07 ENCOUNTER — Other Ambulatory Visit: Payer: BC Managed Care – PPO

## 2019-05-07 ENCOUNTER — Ambulatory Visit (INDEPENDENT_AMBULATORY_CARE_PROVIDER_SITE_OTHER)
Admission: RE | Admit: 2019-05-07 | Discharge: 2019-05-07 | Disposition: A | Discharge status: Home / Self Care | Payer: BC Managed Care – PPO | Source: Ambulatory Visit | Attending: Internal Medicine | Admitting: Internal Medicine

## 2019-05-07 DIAGNOSIS — R202 Paresthesia of skin: Secondary | ICD-10-CM | POA: Diagnosis not present

## 2019-05-07 DIAGNOSIS — M48061 Spinal stenosis, lumbar region without neurogenic claudication: Secondary | ICD-10-CM | POA: Diagnosis not present

## 2019-05-15 ENCOUNTER — Telehealth: Payer: Self-pay | Admitting: Internal Medicine

## 2019-05-15 NOTE — Telephone Encounter (Signed)
Patient called and said she'd like to be referred to physical therapy.  Patient wants to go to [facility].  Patient can only go in the afternoon after 4:00.

## 2019-05-16 ENCOUNTER — Other Ambulatory Visit: Payer: Self-pay | Admitting: Internal Medicine

## 2019-05-16 DIAGNOSIS — M5416 Radiculopathy, lumbar region: Secondary | ICD-10-CM

## 2019-05-16 NOTE — Telephone Encounter (Signed)
Referral placed.

## 2019-05-23 ENCOUNTER — Ambulatory Visit: Payer: BC Managed Care – PPO | Admitting: Podiatry

## 2019-06-07 ENCOUNTER — Telehealth: Payer: Self-pay

## 2019-06-07 NOTE — Telephone Encounter (Signed)
Appt made and patient aware.  

## 2019-06-07 NOTE — Telephone Encounter (Signed)
Pt left v/m pt cked my chart note and does not see information about referral for PT. Pt said this has been several wks and request cb. FYI to [facility] and [place] CMA/

## 2019-06-10 DIAGNOSIS — M545 Low back pain: Secondary | ICD-10-CM | POA: Diagnosis not present

## 2019-06-14 DIAGNOSIS — M545 Low back pain: Secondary | ICD-10-CM | POA: Diagnosis not present

## 2019-07-12 DIAGNOSIS — M545 Low back pain: Secondary | ICD-10-CM | POA: Diagnosis not present

## 2019-08-01 ENCOUNTER — Ambulatory Visit (INDEPENDENT_AMBULATORY_CARE_PROVIDER_SITE_OTHER): Payer: BC Managed Care – PPO

## 2019-08-01 DIAGNOSIS — Z23 Encounter for immunization: Secondary | ICD-10-CM | POA: Diagnosis not present

## 2019-11-06 DIAGNOSIS — Z20828 Contact with and (suspected) exposure to other viral communicable diseases: Secondary | ICD-10-CM | POA: Diagnosis not present

## 2019-11-07 DIAGNOSIS — Z20828 Contact with and (suspected) exposure to other viral communicable diseases: Secondary | ICD-10-CM | POA: Diagnosis not present

## 2020-04-15 DIAGNOSIS — Z01419 Encounter for gynecological examination (general) (routine) without abnormal findings: Secondary | ICD-10-CM | POA: Diagnosis not present

## 2020-04-15 DIAGNOSIS — Z124 Encounter for screening for malignant neoplasm of cervix: Secondary | ICD-10-CM | POA: Diagnosis not present

## 2020-04-15 DIAGNOSIS — Z1231 Encounter for screening mammogram for malignant neoplasm of breast: Secondary | ICD-10-CM | POA: Diagnosis not present

## 2020-04-15 DIAGNOSIS — Z6826 Body mass index (BMI) 26.0-26.9, adult: Secondary | ICD-10-CM | POA: Diagnosis not present

## 2020-10-14 DIAGNOSIS — R350 Frequency of micturition: Secondary | ICD-10-CM | POA: Diagnosis not present

## 2020-10-19 ENCOUNTER — Ambulatory Visit: Payer: BC Managed Care – PPO | Admitting: Internal Medicine

## 2020-10-19 DIAGNOSIS — Z0289 Encounter for other administrative examinations: Secondary | ICD-10-CM

## 2020-10-19 NOTE — Progress Notes (Deleted)
HPI  Pt presents to the clinic today with c/o urinary symptoms.   Review of Systems  Past Medical History:  Diagnosis Date  . AMA (advanced maternal age) multigravida 13+   . Fibroid     No family history on file.  Social History   Socioeconomic History  . Marital status: Married    Spouse name: Not on file  . Number of children: 2  . Years of education: Not on file  . Highest education level: Not on file  Occupational History    Employer: [employer]  Tobacco Use  . Smoking status: Never Smoker  . Smokeless tobacco: Never Used  Substance and Sexual Activity  . Alcohol use: No    Alcohol/week: 0.0 standard drinks  . Drug use: No  . Sexual activity: Never  Other Topics Concern  . Not on file  Social History Narrative   Lives with husband and 2 daughters in [place]..Used to work in Science writer, now Probation officer      Regular exercise-yes   Social Determinants of Radio broadcast assistant Strain: Not on file  Food Insecurity: Not on file  Transportation Needs: Not on file  Physical Activity: Not on file  Stress: Not on file  Social Connections: Not on file  Intimate Partner Violence: Not on file    Allergies  Allergen Reactions  . Duramorph [Morphine] Itching     Constitutional: Denies fever, malaise, fatigue, headache or abrupt weight changes.   GU: Pt reports urgency, frequency and pain with urination. Denies burning sensation, blood in urine, odor or discharge. Skin: Denies redness, rashes, lesions or ulcercations.   No other specific complaints in a complete review of systems (except as listed in HPI above).    Objective:   Physical Exam  There were no vitals taken for this visit. Wt Readings from Last 3 Encounters:  10/25/17 157 lb (71.2 kg)  09/20/17 161 lb (73 kg)  03/21/17 195 lb (88.5 kg)    General: Appears her stated age, well developed, well nourished in NAD. Cardiovascular: Normal rate and rhythm. S1,S2 noted.    Pulmonary/Chest: Normal effort and positive vesicular breath sounds. No respiratory distress. No wheezes, rales or ronchi noted.  Abdomen: Soft. Normal bowel sounds. No distention or masses noted.  Tender to palpation over the bladder area. No CVA tenderness.        Assessment & Plan:   Urgency, Frequency, Dysuria secondary to   Urinalysis: Will send urine culture eRx sent if for Macrobid 100 mg BID x 5 days OK to take AZO OTC Drink plenty of fluids  RTC as needed or if symptoms persist. Webb Silversmith, NP This visit occurred during the SARS-CoV-2 public health emergency.  Safety protocols were in place, including screening questions prior to the visit, additional usage of staff PPE, and extensive cleaning of exam room while observing appropriate contact time as indicated for disinfecting solutions.

## 2021-06-15 DIAGNOSIS — R053 Chronic cough: Secondary | ICD-10-CM | POA: Diagnosis not present

## 2021-06-15 DIAGNOSIS — U099 Post covid-19 condition, unspecified: Secondary | ICD-10-CM | POA: Diagnosis not present

## 2021-06-15 DIAGNOSIS — J4521 Mild intermittent asthma with (acute) exacerbation: Secondary | ICD-10-CM | POA: Diagnosis not present

## 2021-06-15 DIAGNOSIS — R059 Cough, unspecified: Secondary | ICD-10-CM | POA: Diagnosis not present

## 2021-07-01 DIAGNOSIS — Z124 Encounter for screening for malignant neoplasm of cervix: Secondary | ICD-10-CM | POA: Diagnosis not present

## 2021-07-01 DIAGNOSIS — Z01419 Encounter for gynecological examination (general) (routine) without abnormal findings: Secondary | ICD-10-CM | POA: Diagnosis not present

## 2021-07-01 DIAGNOSIS — Z1231 Encounter for screening mammogram for malignant neoplasm of breast: Secondary | ICD-10-CM | POA: Diagnosis not present

## 2021-07-01 LAB — HM PAP SMEAR: HM Pap smear: NEGATIVE

## 2021-07-01 LAB — HM MAMMOGRAPHY

## 2021-07-01 LAB — RESULTS CONSOLE HPV: CHL HPV: NEGATIVE

## 2021-08-09 DIAGNOSIS — N920 Excessive and frequent menstruation with regular cycle: Secondary | ICD-10-CM | POA: Diagnosis not present

## 2021-08-10 ENCOUNTER — Ambulatory Visit: Payer: BC Managed Care – PPO | Admitting: Pulmonary Disease

## 2021-08-10 ENCOUNTER — Other Ambulatory Visit: Payer: Self-pay

## 2021-08-10 ENCOUNTER — Ambulatory Visit (INDEPENDENT_AMBULATORY_CARE_PROVIDER_SITE_OTHER): Payer: BC Managed Care – PPO

## 2021-08-10 ENCOUNTER — Encounter: Payer: Self-pay | Admitting: Pulmonary Disease

## 2021-08-10 VITALS — BP 100/60 | HR 88 | Temp 98.2°F | Ht 61.5 in | Wt 160.8 lb

## 2021-08-10 DIAGNOSIS — R0609 Other forms of dyspnea: Secondary | ICD-10-CM

## 2021-08-10 DIAGNOSIS — R059 Cough, unspecified: Secondary | ICD-10-CM | POA: Diagnosis not present

## 2021-08-10 DIAGNOSIS — R0600 Dyspnea, unspecified: Secondary | ICD-10-CM | POA: Diagnosis not present

## 2021-08-10 DIAGNOSIS — R052 Subacute cough: Secondary | ICD-10-CM | POA: Diagnosis not present

## 2021-08-10 MED ORDER — FLUTICASONE-SALMETEROL 500-50 MCG/ACT IN AEPB: 1.0000 | INHALATION_SPRAY | Freq: Two times a day (BID) | RESPIRATORY_TRACT | 2 refills | Status: DC

## 2021-08-10 NOTE — Patient Instructions (Addendum)
Nice to meet you  Use the new inhaler, 1 puff twice a day - every day.  Rinse mouth with water after every use.  We will get a chest xray today  We will get breathing tests at next visit  Return to clinic in 8 weeks, PFT before or same day, f/u with Dr. Silas Flood

## 2021-08-11 NOTE — Progress Notes (Signed)
@Patient  ID: Brittney Steele, female    DOB: Dec 31, 1975, 45 y.o.   MRN: [id number]  Chief Complaint  Patient presents with   Consult    Patient states that she got Covid in September and has been having shortness of breath and cough. Dry cough. Shortness of breath with exertion.    Referring provider: Jearld Fenton, NP  HPI:   45 y.o. woman whom we are seeing in consultation for evaluation of cough, dyspnea exertion.  PCP note reviewed.  Patient was in normal state of health.  Denies any breathing issues or cough issues prior.  Contracted COVID early 06/2021.  Cough, shortness of breath, fever.  Symptoms gradually have improved.  However, she has persistent cough as well as dyspnea on exertion.  Also shortness of breath at rest.  Dyspnea worse with inclines or stairs.  Sometimes developed difficulty breathing or sensation of not getting deep breath in and out when sitting still.  In general rest helps.  Also she has cough.  Mostly dry.  Worse in the evenings.  Sometimes noticeable after eating lunch, couple hours afterwards in the afternoon.  Otherwise, the time of day when symptoms are better or worse.  No position to make things better or worse.  No environmental factors she can identify that make things better or worse.  She was prescribed albuterol by urgent care.  Not very helpful.  Did use prednisone for a while and helped mildly.  She denies history of atopic symptoms.  No real issues with seasonal allergies.  No rashes.   PMH: Reviewed, no significant history Surgical history: C-section, hernia repair Family history:History reviewed. No pertinent family history. 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  MMRC: No flowsheet data found.  Epworth:  No flowsheet data found.  Tests:   FENO:  No results found for: NITRICOXIDE  PFT: No flowsheet data found.  WALK:  No  flowsheet data found.  Imaging:   Lab Results: Personally reviewed CBC    Component Value Date/Time   WBC 7.1 03/08/2019 0925   RBC 4.47 03/08/2019 0925   HGB 12.3 03/08/2019 0925   HCT 36.9 03/08/2019 0925   PLT 242.0 03/08/2019 0925   MCV 82.5 03/08/2019 0925   MCH 28.9 03/22/2017 0533   MCHC 33.4 03/08/2019 0925   RDW 12.9 03/08/2019 0925   LYMPHSABS 2.8 10/06/2010 0819   MONOABS 0.5 10/06/2010 0819   EOSABS 0.0 10/06/2010 0819   BASOSABS 0.0 10/06/2010 0819    BMET    Component Value Date/Time   NA 137 03/08/2019 0925   K 3.9 03/08/2019 0925   CL 103 03/08/2019 0925   CO2 25 03/08/2019 0925   GLUCOSE 82 03/08/2019 0925   BUN 11 03/08/2019 0925   CREATININE 0.86 03/08/2019 0925   CALCIUM 9.1 03/08/2019 0925   GFRNONAA 126.25 10/06/2010 0819    BNP No results found for: BNP  ProBNP No results found for: PROBNP  Specialty Problems       Pulmonary Problems   Allergy-induced asthma    Allergies  Allergen Reactions   Duramorph [Morphine] Itching    Immunization History  Administered Date(s) Administered   Hepatitis A, Adult 09/18/2014   Hepatitis B, adult 09/18/2014, 10/24/2014, 03/25/2015   Influenza,inj,Quad PF,6+ Mos 08/28/2013, 09/18/2014, 10/25/2017, 07/26/2018, 08/01/2019   Influenza,inj,Quad PF,6-35 Mos 06/16/2015   PPD Test 09/23/2013   Tdap 09/23/2013, 12/26/2016    Past  Medical History:  Diagnosis Date   AMA (advanced maternal age) multigravida 35+    Fibroid     Tobacco History: Social History   Tobacco Use  Smoking Status Never  Smokeless Tobacco Never   Counseling given: Not Answered   Continue to not smoke  Outpatient Encounter Medications as of 08/10/2021  Medication Sig   albuterol (VENTOLIN HFA) 108 (90 Base) MCG/ACT inhaler albuterol sulfate HFA 90 mcg/actuation aerosol inhaler  INHALE 2 PUFFS INTO THE LUNGS EVERY 6 HOURS AS NEEDED FOR WHEEZING   Ascorbic Acid (VITAMIN C) 1000 MG tablet Take 1,000 mg by mouth  daily.   fluticasone-salmeterol (ADVAIR) 500-50 MCG/ACT AEPB Inhale 1 puff into the lungs in the morning and at bedtime.   MAGNESIUM GLUCONATE PO Take by mouth.   Multiple Vitamin (MULTIVITAMIN) tablet Take 1 tablet by mouth daily.   Zinc Sulfate (ZINC 15 PO) Take by mouth.   No facility-administered encounter medications on file as of 08/10/2021.     Review of Systems  Review of Systems  No chest pain with exertion.  No orthopnea or PND.  No lower extremity swelling. 8 pound weight gain since COVID 9/22. Physical Exam  BP 100/60 (BP Location: Right Arm, Patient Position: Sitting, Cuff Size: Normal)   Pulse 88   Temp 98.2 F (36.8 C) (Oral)   Ht 5' 1.5" (1.562 m)   Wt 160 lb 12.8 oz (72.9 kg)   SpO2 99%   BMI 29.89 kg/m   Wt Readings from Last 5 Encounters:  08/10/21 160 lb 12.8 oz (72.9 kg)  10/25/17 157 lb (71.2 kg)  09/20/17 161 lb (73 kg)  03/21/17 195 lb (88.5 kg)  10/19/15 147 lb (66.7 kg)    BMI Readings from Last 5 Encounters:  08/10/21 29.89 kg/m  10/25/17 29.66 kg/m  09/20/17 30.42 kg/m  03/21/17 36.84 kg/m  10/19/15 27.78 kg/m     Physical Exam General: Well appearing, in NAD Eyes: EOMI, no icterus Neck: supple, No JVP Pulmonary:  Clear, NWOB on RA CV: RRR, no murmurs, warm Abd: ND, BS present MSK: No synovitis, No joint effusions Neuro: Normal gait, no weakness Psych: normal mood, full affect     Assessment & Plan:   Dyspnea exertion: Following COVID-19 infection 06/2021.  Gradually improving but lingering.  Likely multifactorial related to deconditioning, mild weight gain, concern for post viral reactive airway/asthma.  Chest x-ray today as no recent chest imaging.  PFTs ordered today for further evaluation, will complete 09/2021 due about 3 months out from illness to allow for most accurate results. Trial of ICS/LABA (Advair/Wixela) and assess response.  Cough: Following COVID-19 infection 06/2021.  Improved more so than dyspnea.  Still  persistent.  Worse in the evenings.  High suspicion for postviral reactive airway/asthma.  ICS/LABA therapy as above.   Return in about 8 weeks (around 10/05/2021).   Lanier Clam, MD 08/11/2021

## 2021-10-06 ENCOUNTER — Ambulatory Visit (INDEPENDENT_AMBULATORY_CARE_PROVIDER_SITE_OTHER): Payer: BC Managed Care – PPO

## 2021-10-06 ENCOUNTER — Other Ambulatory Visit: Payer: Self-pay

## 2021-10-06 ENCOUNTER — Encounter: Payer: Self-pay | Admitting: Nurse Practitioner

## 2021-10-06 DIAGNOSIS — Z23 Encounter for immunization: Secondary | ICD-10-CM

## 2021-10-07 ENCOUNTER — Ambulatory Visit: Payer: BC Managed Care – PPO | Admitting: Pulmonary Disease

## 2021-10-07 ENCOUNTER — Encounter: Payer: Self-pay | Admitting: Pulmonary Disease

## 2021-10-07 ENCOUNTER — Ambulatory Visit (INDEPENDENT_AMBULATORY_CARE_PROVIDER_SITE_OTHER): Payer: BC Managed Care – PPO | Admitting: Pulmonary Disease

## 2021-10-07 VITALS — BP 120/80 | HR 85 | Temp 98.3°F | Ht 61.0 in | Wt 165.0 lb

## 2021-10-07 DIAGNOSIS — R0609 Other forms of dyspnea: Secondary | ICD-10-CM

## 2021-10-07 DIAGNOSIS — J4520 Mild intermittent asthma, uncomplicated: Secondary | ICD-10-CM

## 2021-10-07 DIAGNOSIS — R053 Chronic cough: Secondary | ICD-10-CM

## 2021-10-07 NOTE — Progress Notes (Signed)
@Patient  ID: Brittney Steele, female    DOB: 1976/06/16, 44 y.o.   MRN: [id number]  Chief Complaint  Patient presents with   Follow-up    Follow up with PFTS. Pt states that her SOB is doing better. Only has issues when the heat comes on. The cough has subsided.     Referring provider: Jearld Fenton, NP  HPI:   45 y.o. woman whom we are seeing in follow-up for evaluation of cough, dyspnea exertion felt to be asthma triggered by COVID infection 05/2021.  PFTs reviewed and interpreted today.  Patient presents for PFTs and follow-up.  She reports marked improvement dyspnea on high-dose Advair.  Cough now resolved.  Does occur sometimes at night when the heat comes on.  Overall satisfied with improvement in symptoms.  Discussed continuing current regimen which she agrees with.  Reviewed PFTs in depth with patient reveals normal spirometry, no bronchodilator response, lung volumes within normal limits, DLCO within normal limits.  Notably she used Advair this morning.  Chest x-ray at last visit 08/10/2021 reviewed interpreted as clear lungs bilaterally.  HPI at initial visit: Patient was in normal state of health.  Denies any breathing issues or cough issues prior.  Contracted COVID early 06/2021.  Cough, shortness of breath, fever.  Symptoms gradually have improved.  However, she has persistent cough as well as dyspnea on exertion.  Also shortness of breath at rest.  Dyspnea worse with inclines or stairs.  Sometimes developed difficulty breathing or sensation of not getting deep breath in and out when sitting still.  In general rest helps.  Also she has cough.  Mostly dry.  Worse in the evenings.  Sometimes noticeable after eating lunch, couple hours afterwards in the afternoon.  Otherwise, the time of day when symptoms are better or worse.  No position to make things better or worse.  No environmental factors she can identify that make things better or worse.  She was prescribed albuterol by urgent  care.  Not very helpful.  Did use prednisone for a while and helped mildly.  She denies history of atopic symptoms.  No real issues with seasonal allergies.  No rashes.   PMH: Reviewed, no significant history Surgical history: C-section, hernia repair Family history:History reviewed. No pertinent family history. 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  MMRC: mMRC Dyspnea Scale mMRC Score  10/07/2021 1    Epworth:  No flowsheet data found.  Tests:   FENO:  No results found for: NITRICOXIDE  PFT: 10/07/2021 personally reviewed interpreted as normal spirometry, no bronchodilator response, lung like within the limits, DLCO within normal limits  WALK:  No flowsheet data found.  Imaging: Personally reviewed  Lab Results: Personally reviewed CBC    Component Value Date/Time   WBC 7.1 03/08/2019 0925   RBC 4.47 03/08/2019 0925   HGB 12.3 03/08/2019 0925   HCT 36.9 03/08/2019 0925   PLT 242.0 03/08/2019 0925   MCV 82.5 03/08/2019 0925   MCH 28.9 03/22/2017 0533   MCHC 33.4 03/08/2019 0925   RDW 12.9 03/08/2019 0925   LYMPHSABS 2.8 10/06/2010 0819   MONOABS 0.5 10/06/2010 0819   EOSABS 0.0 10/06/2010 0819   BASOSABS 0.0 10/06/2010 0819    BMET    Component Value Date/Time   NA 137 03/08/2019 0925   K 3.9 03/08/2019 0925   CL 103 03/08/2019 0925   CO2 25 03/08/2019  3419   GLUCOSE 82 03/08/2019 0925   BUN 11 03/08/2019 0925   CREATININE 0.86 03/08/2019 0925   CALCIUM 9.1 03/08/2019 0925   GFRNONAA 126.25 10/06/2010 0819    BNP No results found for: BNP  ProBNP No results found for: PROBNP  Specialty Problems       Pulmonary Problems   Allergy-induced asthma    Allergies  Allergen Reactions   Duramorph [Morphine] Itching    Immunization History  Administered Date(s) Administered   Hepatitis A, Adult 09/18/2014   Hepatitis B, adult 09/18/2014,  10/24/2014, 03/25/2015   Influenza,inj,Quad PF,6+ Mos 08/28/2013, 09/18/2014, 10/25/2017, 07/26/2018, 08/01/2019, 10/06/2021   Influenza,inj,Quad PF,6-35 Mos 06/16/2015   PPD Test 09/23/2013   Tdap 09/23/2013, 12/26/2016    Past Medical History:  Diagnosis Date   AMA (advanced maternal age) multigravida 35+    Fibroid     Tobacco History: Social History   Tobacco Use  Smoking Status Never  Smokeless Tobacco Never   Counseling given: Not Answered   Continue to not smoke  Outpatient Encounter Medications as of 10/07/2021  Medication Sig   albuterol (VENTOLIN HFA) 108 (90 Base) MCG/ACT inhaler albuterol sulfate HFA 90 mcg/actuation aerosol inhaler  INHALE 2 PUFFS INTO THE LUNGS EVERY 6 HOURS AS NEEDED FOR WHEEZING   Ascorbic Acid (VITAMIN C) 1000 MG tablet Take 1,000 mg by mouth daily.   fluticasone-salmeterol (ADVAIR) 500-50 MCG/ACT AEPB Inhale 1 puff into the lungs in the morning and at bedtime.   MAGNESIUM GLUCONATE PO Take by mouth.   Multiple Vitamin (MULTIVITAMIN) tablet Take 1 tablet by mouth daily.   Zinc Sulfate (ZINC 15 PO) Take by mouth.   No facility-administered encounter medications on file as of 10/07/2021.     Review of Systems  Review of Systems  N/a  Physical Exam  BP 120/80 (BP Location: Left Arm, Patient Position: Sitting, Cuff Size: Normal)    Pulse 85    Temp 98.3 F (36.8 C) (Oral)    Ht 5\' 1"  (1.549 m)    Wt 165 lb (74.8 kg)    SpO2 100%    BMI 31.18 kg/m   Wt Readings from Last 5 Encounters:  10/07/21 165 lb (74.8 kg)  08/10/21 160 lb 12.8 oz (72.9 kg)  10/25/17 157 lb (71.2 kg)  09/20/17 161 lb (73 kg)  03/21/17 195 lb (88.5 kg)    BMI Readings from Last 5 Encounters:  10/07/21 31.18 kg/m  08/10/21 29.89 kg/m  10/25/17 29.66 kg/m  09/20/17 30.42 kg/m  03/21/17 36.84 kg/m     Physical Exam General: Well appearing, in NAD Eyes: EOMI, no icterus Neck: supple, No JVP Pulmonary:  Clear, NWOB on RA CV: RRR, no murmurs,  warm MSK: No synovitis, No joint effusions Neuro: Normal gait, no weakness Psych: normal mood, full affect     Assessment & Plan:   Dyspnea exertion: Following COVID-19 infection fall 2022.  Gradually improving but lingering.  Likely multifactorial related to deconditioning, mild weight gain, concern for post viral reactive airway/asthma.  Chest x-ray 08/2021 clear.  PFTs 09/2021 within normal limits.  Improved on high-dose Advair.  Cough: Following COVID-19 infection 06/2021.  Improved on high-dose Advair.  Mild lingering cough in the evenings, when the heat Per Her Report.  Asthma: Cough, dyspnea exertion improved with ICS/LABA therapy.  Symptoms started after COVID infection, likely postviral activation of asthma.  To continue high-dose Advair.   Return in about 6 months (around 04/07/2022).   Lanier Clam, MD 10/07/2021

## 2021-10-07 NOTE — Patient Instructions (Signed)
Nice to see you again  Continue the Advair - no changes today  Please call if cough or shortness of breath worsens  Return to clinic in 6 months or sooner as needed

## 2021-10-07 NOTE — Progress Notes (Signed)
Full PFT completed today ? ?

## 2021-10-08 LAB — PULMONARY FUNCTION TEST
DL/VA % pred: 102 %
DL/VA: 4.62 ml/min/mmHg/L
DLCO cor % pred: 86 %
DLCO cor: 16.93 ml/min/mmHg
DLCO unc % pred: 86 %
DLCO unc: 16.93 ml/min/mmHg
FEF 25-75 Post: 1.83 L/sec
FEF 25-75 Pre: 2.19 L/sec
FEF2575-%Change-Post: -16 %
FEF2575-%Pred-Post: 73 %
FEF2575-%Pred-Pre: 87 %
FEV1-%Change-Post: -4 %
FEV1-%Pred-Post: 86 %
FEV1-%Pred-Pre: 91 %
FEV1-Post: 1.93 L
FEV1-Pre: 2.02 L
FEV1FVC-%Change-Post: 2 %
FEV1FVC-%Pred-Pre: 100 %
FEV6-%Change-Post: -7 %
FEV6-%Pred-Post: 84 %
FEV6-%Pred-Pre: 91 %
FEV6-Post: 2.24 L
FEV6-Pre: 2.42 L
FEV6FVC-%Pred-Post: 102 %
FEV6FVC-%Pred-Pre: 102 %
FVC-%Change-Post: -7 %
FVC-%Pred-Post: 82 %
FVC-%Pred-Pre: 89 %
FVC-Post: 2.24 L
FVC-Pre: 2.42 L
Post FEV1/FVC ratio: 86 %
Post FEV6/FVC ratio: 100 %
Pre FEV1/FVC ratio: 84 %
Pre FEV6/FVC Ratio: 100 %
RV % pred: 89 %
RV: 1.33 L
TLC % pred: 82 %
TLC: 3.81 L

## 2021-10-14 DIAGNOSIS — M79671 Pain in right foot: Secondary | ICD-10-CM | POA: Diagnosis not present

## 2021-10-21 DIAGNOSIS — M79671 Pain in right foot: Secondary | ICD-10-CM | POA: Diagnosis not present

## 2022-01-14 DIAGNOSIS — D509 Iron deficiency anemia, unspecified: Secondary | ICD-10-CM | POA: Diagnosis not present

## 2022-01-14 DIAGNOSIS — N951 Menopausal and female climacteric states: Secondary | ICD-10-CM | POA: Diagnosis not present

## 2022-01-14 DIAGNOSIS — Z6830 Body mass index (BMI) 30.0-30.9, adult: Secondary | ICD-10-CM | POA: Diagnosis not present

## 2022-01-14 DIAGNOSIS — E559 Vitamin D deficiency, unspecified: Secondary | ICD-10-CM | POA: Diagnosis not present

## 2022-01-14 DIAGNOSIS — E785 Hyperlipidemia, unspecified: Secondary | ICD-10-CM | POA: Diagnosis not present

## 2022-01-14 DIAGNOSIS — R635 Abnormal weight gain: Secondary | ICD-10-CM | POA: Diagnosis not present

## 2022-01-14 DIAGNOSIS — R5383 Other fatigue: Secondary | ICD-10-CM | POA: Diagnosis not present

## 2022-01-25 DIAGNOSIS — R232 Flushing: Secondary | ICD-10-CM | POA: Diagnosis not present

## 2022-01-25 DIAGNOSIS — N951 Menopausal and female climacteric states: Secondary | ICD-10-CM | POA: Diagnosis not present

## 2022-01-25 DIAGNOSIS — Z1331 Encounter for screening for depression: Secondary | ICD-10-CM | POA: Diagnosis not present

## 2022-01-25 DIAGNOSIS — R635 Abnormal weight gain: Secondary | ICD-10-CM | POA: Diagnosis not present

## 2022-01-25 DIAGNOSIS — Z1339 Encounter for screening examination for other mental health and behavioral disorders: Secondary | ICD-10-CM | POA: Diagnosis not present

## 2022-01-25 DIAGNOSIS — Z6830 Body mass index (BMI) 30.0-30.9, adult: Secondary | ICD-10-CM | POA: Diagnosis not present

## 2022-02-09 ENCOUNTER — Encounter: Payer: Self-pay | Admitting: Nurse Practitioner

## 2022-02-09 ENCOUNTER — Ambulatory Visit: Payer: BC Managed Care – PPO | Admitting: Nurse Practitioner

## 2022-02-09 VITALS — BP 124/62 | HR 83 | Temp 97.7°F | Resp 10 | Ht 61.0 in | Wt 168.1 lb

## 2022-02-09 DIAGNOSIS — Z6831 Body mass index (BMI) 31.0-31.9, adult: Secondary | ICD-10-CM

## 2022-02-09 DIAGNOSIS — Z1321 Encounter for screening for nutritional disorder: Secondary | ICD-10-CM | POA: Diagnosis not present

## 2022-02-09 DIAGNOSIS — E6609 Other obesity due to excess calories: Secondary | ICD-10-CM

## 2022-02-09 DIAGNOSIS — E66811 Obesity, class 1: Secondary | ICD-10-CM | POA: Insufficient documentation

## 2022-02-09 DIAGNOSIS — Z0000 Encounter for general adult medical examination without abnormal findings: Secondary | ICD-10-CM

## 2022-02-09 DIAGNOSIS — R7989 Other specified abnormal findings of blood chemistry: Secondary | ICD-10-CM | POA: Diagnosis not present

## 2022-02-09 DIAGNOSIS — J4520 Mild intermittent asthma, uncomplicated: Secondary | ICD-10-CM

## 2022-02-09 NOTE — Assessment & Plan Note (Signed)
Pending vitamin D 

## 2022-02-09 NOTE — Progress Notes (Signed)
? ?Established Patient Office Visit ? ?Subjective   ?Patient ID: Brittney Steele, female    DOB: Feb 07, 1976  Age: 46 y.o. MRN: [id number] ? ?Chief Complaint  ?Patient presents with  ? Transfer of Care  ? ? ?HPI ? ?TOC ? ?Asthma/ Covid long hauler: States that she is seen by Larey Days. Maintained on Advair and albuterol  ? ?for complete physical and follow up of chronic conditions. ? ?Immunizations: ?-Tetanus:2018 ?-Influenza: 2022, up to date ?-Covid-19: pfizer x2 ?-Shingles: NA ?-Pneumonia: NA ? ? ?Diet: Fair diet. 5 small meals and 2 snacks a day. [place] ?Exercise: No regular exercise. ? ?Eye exam: Completes annually  ?Dental exam: Completes semi-annually  ? ?Pap Smear: followed by GYN. [employer].  ?Mammogram: Completed in 2022 past 6 month ?Colonoscopy: too young ?Dexa: NA ? ? ?Lung Cancer Screening: NA ? ?Sleep: Bed aroun 10 and get up at [place]. Feel rested. No snoring ? ? ? ?Review of Systems  ?Constitutional:  Positive for malaise/fatigue. Negative for chills and fever.  ?Respiratory:  Positive for shortness of breath (rest and DOE. followed by Pulm). Negative for wheezing.   ?Cardiovascular:  Negative for chest pain and leg swelling.  ?Gastrointestinal:  Negative for abdominal pain, constipation, nausea and vomiting.  ?     Bm daily ?  ?Genitourinary:  Negative for dysuria, frequency and hematuria.  ?Neurological:  Negative for dizziness, tingling, weakness and headaches.  ?Psychiatric/Behavioral:  Negative for hallucinations and suicidal ideas.   ? ?  ?Objective:  ?  ? ?BP 124/62   Pulse 83   Temp 97.7 ?F (36.5 ?C)   Resp 10   Ht '5\' 1"'$  (1.549 m)   Wt 168 lb 2 oz (76.3 kg)   LMP 02/08/2022 Comment: taking medication to stop having menstrual cycle-as of 02/09/22  SpO2 100%   Breastfeeding No   BMI 31.77 kg/m?  ? ? ?Physical Exam ?Vitals and nursing note reviewed.  ?Constitutional:   ?   Appearance: Normal appearance.  ?HENT:  ?   Right Ear: Tympanic membrane, ear canal and external ear  normal.  ?   Left Ear: Tympanic membrane, ear canal and external ear normal.  ?   Mouth/Throat:  ?   Mouth: Mucous membranes are moist.  ?   Pharynx: Oropharynx is clear.  ?Eyes:  ?   Extraocular Movements: Extraocular movements intact.  ?   Pupils: Pupils are equal, round, and reactive to light.  ?Cardiovascular:  ?   Rate and Rhythm: Normal rate and regular rhythm.  ?   Heart sounds: Normal heart sounds.  ?Pulmonary:  ?   Effort: Pulmonary effort is normal.  ?   Breath sounds: Normal breath sounds.  ?Abdominal:  ?   General: Bowel sounds are normal. There is no distension.  ?   Palpations: There is no mass.  ?   Tenderness: There is no abdominal tenderness.  ?   Hernia: No hernia is present.  ?Musculoskeletal:  ?   Right lower leg: No edema.  ?   Left lower leg: No edema.  ?Lymphadenopathy:  ?   Cervical: No cervical adenopathy.  ?Skin: ?   General: Skin is warm.  ?Neurological:  ?   General: No focal deficit present.  ?   Mental Status: She is alert.  ?   Deep Tendon Reflexes:  ?   Reflex Scores: ?     Bicep reflexes are 2+ on the right side and 2+ on the left side. ?  Patellar reflexes are 2+ on the right side and 2+ on the left side. ?   Comments: Bilateral upper and lower extremity strength 5/5  ?Psychiatric:     ?   Mood and Affect: Mood normal.     ?   Behavior: Behavior normal.     ?   Thought Content: Thought content normal.     ?   Judgment: Judgment normal.  ? ? ? ?No results found for any visits on 02/09/22. ? ? ? ?The 10-year ASCVD risk score (Arnett DK, et al., 2019) is: 0.6% ? ?  ?Assessment & Plan:  ? ?Problem List Items Addressed This Visit   ? ?  ? Respiratory  ? Allergy-induced asthma  ?  States that she has lumbar COVID is followed by pulmonologist.  Currently maintained on Advair and albuterol.  Continue follow-up as scheduled take medication as prescribed ? ?  ?  ?  ? Other  ? Encounter for vitamin deficiency screening - Primary  ?  Pending vitamin D ? ?  ?  ? Relevant Orders  ? VITAMIN D  25 Hydroxy (Vit-D Deficiency, Fractures)  ? Abnormal CBC  ?  Currently on iron states last hemoglobin was in the sevens.  This is because of her menstrual bleeding and has an appointment this following with GYN to discuss surgery per patient report.  Pending lab results ? ?  ?  ? Relevant Orders  ? Vitamin B12  ? Folate  ? IBC + Ferritin  ? Class 1 obesity due to excess calories without serious comorbidity with body mass index (BMI) of 31.0 to 31.9 in adult  ?  Recently seen at [facility] MD.  Pending labs we will check A1c today.  She can continue following up with GYN for weight management ? ?  ?  ? ? ?Return in about 1 year (around 02/10/2023) for cpe with labs.  ? ? ?Romilda Garret, NP ? ?

## 2022-02-09 NOTE — Assessment & Plan Note (Signed)
Recently seen at [facility] MD.  Pending labs we will check A1c today.  She can continue following up with GYN for weight management ?

## 2022-02-09 NOTE — Assessment & Plan Note (Signed)
States that she has lumbar COVID is followed by pulmonologist.  Currently maintained on Advair and albuterol.  Continue follow-up as scheduled take medication as prescribed ?

## 2022-02-09 NOTE — Patient Instructions (Addendum)
Nice to see you today ?Reach out to Dr Carmon Sails for the refills on the inhalers.  ?Follow up with me in 1 year for your next physical. ?I will be in touch with the labs once I have the results ?

## 2022-02-09 NOTE — Assessment & Plan Note (Signed)
Currently on iron states last hemoglobin was in the sevens.  This is because of her menstrual bleeding and has an appointment this following with GYN to discuss surgery per patient report.  Pending lab results ?

## 2022-02-10 LAB — COMPREHENSIVE METABOLIC PANEL
ALT: 11 U/L (ref 0–35)
AST: 14 U/L (ref 0–37)
Albumin: 4.2 g/dL (ref 3.5–5.2)
Alkaline Phosphatase: 52 U/L (ref 39–117)
BUN: 15 mg/dL (ref 6–23)
CO2: 24 mEq/L (ref 19–32)
Calcium: 8.7 mg/dL (ref 8.4–10.5)
Chloride: 106 mEq/L (ref 96–112)
Creatinine, Ser: 0.81 mg/dL (ref 0.40–1.20)
GFR: 88.52 mL/min (ref >60.00)
Glucose, Bld: 81 mg/dL (ref 70–99)
Potassium: 4.3 mEq/L (ref 3.5–5.1)
Sodium: 137 mEq/L (ref 135–145)
Total Bilirubin: 0.2 mg/dL (ref 0.2–1.2)
Total Protein: 6.8 g/dL (ref 6.0–8.3)

## 2022-02-10 LAB — CBC WITH DIFFERENTIAL/PLATELET
Basophils Absolute: 0 10*3/uL (ref 0.0–0.1)
Basophils Relative: 0.6 % (ref 0.0–3.0)
Eosinophils Absolute: 0.1 10*3/uL (ref 0.0–0.7)
Eosinophils Relative: 0.9 % (ref 0.0–5.0)
HCT: 27.4 % — ABNORMAL LOW (ref 36.0–46.0)
Hemoglobin: 8.2 g/dL — ABNORMAL LOW (ref 12.0–15.0)
Lymphocytes Relative: 27.8 % (ref 12.0–46.0)
Lymphs Abs: 1.9 10*3/uL (ref 0.7–4.0)
MCHC: 30 g/dL (ref 30.0–36.0)
MCV: 68.3 fl — ABNORMAL LOW (ref 78.0–100.0)
Monocytes Absolute: 0.6 10*3/uL (ref 0.1–1.0)
Monocytes Relative: 9 % (ref 3.0–12.0)
Neutro Abs: 4.2 10*3/uL (ref 1.4–7.7)
Neutrophils Relative %: 61.7 % (ref 43.0–77.0)
Platelets: 328 10*3/uL (ref 150.0–400.0)
RBC: 4.01 Mil/uL (ref 3.87–5.11)
RDW: 30 % — ABNORMAL HIGH (ref 11.5–15.5)
WBC: 6.9 10*3/uL (ref 4.0–10.5)

## 2022-02-10 LAB — LIPID PANEL
Cholesterol: 169 mg/dL (ref 0–200)
HDL: 67.5 mg/dL (ref >39.00)
LDL Cholesterol: 86 mg/dL (ref 0–99)
NonHDL: 101.13
Total CHOL/HDL Ratio: 2
Triglycerides: 75 mg/dL (ref 0.0–149.0)
VLDL: 15 mg/dL (ref 0.0–40.0)

## 2022-02-10 LAB — IBC + FERRITIN
Ferritin: 22.3 ng/mL (ref 10.0–291.0)
Iron: 78 ug/dL (ref 42–145)
Saturation Ratios: 16 % — ABNORMAL LOW (ref 20.0–50.0)
TIBC: 487.2 ug/dL — ABNORMAL HIGH (ref 250.0–450.0)
Transferrin: 348 mg/dL (ref 212.0–360.0)

## 2022-02-10 LAB — TSH: TSH: 0.89 u[IU]/mL (ref 0.35–5.50)

## 2022-02-10 LAB — VITAMIN D 25 HYDROXY (VIT D DEFICIENCY, FRACTURES): VITD: 22.07 ng/mL — ABNORMAL LOW (ref 30.00–100.00)

## 2022-02-10 LAB — FOLATE: Folate: 14.1 ng/mL (ref >5.9)

## 2022-02-10 LAB — HEMOGLOBIN A1C: Hgb A1c MFr Bld: 4.7 % (ref 4.6–6.5)

## 2022-02-10 LAB — VITAMIN B12: Vitamin B-12: 208 pg/mL — ABNORMAL LOW (ref 211–911)

## 2022-02-14 DIAGNOSIS — Z6831 Body mass index (BMI) 31.0-31.9, adult: Secondary | ICD-10-CM | POA: Diagnosis not present

## 2022-02-14 DIAGNOSIS — D259 Leiomyoma of uterus, unspecified: Secondary | ICD-10-CM | POA: Diagnosis not present

## 2022-02-16 DIAGNOSIS — Z6831 Body mass index (BMI) 31.0-31.9, adult: Secondary | ICD-10-CM | POA: Diagnosis not present

## 2022-02-16 DIAGNOSIS — E785 Hyperlipidemia, unspecified: Secondary | ICD-10-CM | POA: Diagnosis not present

## 2022-02-16 DIAGNOSIS — D509 Iron deficiency anemia, unspecified: Secondary | ICD-10-CM | POA: Diagnosis not present

## 2022-02-28 DIAGNOSIS — Z6830 Body mass index (BMI) 30.0-30.9, adult: Secondary | ICD-10-CM | POA: Diagnosis not present

## 2022-02-28 DIAGNOSIS — E785 Hyperlipidemia, unspecified: Secondary | ICD-10-CM | POA: Diagnosis not present

## 2022-04-13 ENCOUNTER — Encounter (HOSPITAL_BASED_OUTPATIENT_CLINIC_OR_DEPARTMENT_OTHER): Payer: Self-pay | Admitting: Obstetrics and Gynecology

## 2022-04-13 ENCOUNTER — Other Ambulatory Visit: Payer: Self-pay

## 2022-04-13 NOTE — Progress Notes (Signed)
Spoke w/ via phone for pre-op interview---pt  Lab needs dos----  T and screen, CBC abd UPT            Lab results------n/a COVID test -----patient states asymptomatic no test needed Arrive at -------0630 NPO after MN NO Solid Food.  Clear liquids from MN until---0530 Med rec completed Medications to take morning of surgery -----inhalers Diabetic medication -----n/a Patient instructed no nail polish to be worn day of surgery Patient instructed to bring photo id and insurance card day of surgery Patient aware to have Driver (ride ) / caregiver  husband Teacher, adult education   for 24 hours after surgery  Patient Special Instructions -----bring inhalers DOS Pre-Op special Istructions -----n/a Patient verbalized understanding of instructions that were given at this phone interview. Patient denies shortness of breath, chest pain, fever, cough at this phone interview.

## 2022-04-19 NOTE — H&P (Signed)
Brittney Steele is an 46 y.o. female. P3 with submucosal fibroid and menorrhagia, failed medical mgmt  Reviewed GYN [place]: 7.4x6.4cm anteverted uterus, EMS 4.43m, multiple fibroids visualized. #1 2.4cm right side fibroid appears to have submucosal component, #2 1.7cm anterior fundus, #3 1.1cm posterior fundus.   Light bleeding on Norethindrone '10mg'$  daily.   Menstrual History:  No LMP recorded.    Past Medical History:  Diagnosis Date   AMA (advanced maternal age) multigravida 35+    Anemia    followed by Dr. MLarey Days  Asthma    COVID    oct. 2022, long covid had a cough and DOE, all has resolved now07/02/2022   Fibroid     Past Surgical History:  Procedure Laterality Date   CESAREAN SECTION N/A    09/21/05, 03/27/08, and last 03/21/2017   CESAREAN SECTION N/A 03/21/2017   Procedure: REPEAT CESAREAN SECTION;  Surgeon: AOlga Millers MD;  [facility] [facility]  Service: Obstetrics;  Laterality: N/A;   DILATION AND CURETTAGE OF UTERUS     DILATION AND EVACUATION N/A 05/04/2013   Procedure: DILATATION AND EVACUATION;  Surgeon: SAllyn Kenner DO;  [facility];  Service: Gynecology;  Laterality: N/A;   HERNIA REPAIR      Family History  Problem Relation Age of Onset   Diabetes Mother    Hypertension Maternal Grandmother     Social History:  reports that she has never smoked. She has never used smokeless tobacco. She reports that she does not drink alcohol and does not use drugs.  Allergies:  Allergies  Allergen Reactions   Duramorph [Morphine] Itching    No medications prior to admission.    Review of Systems  Constitutional:  Positive for fatigue. Negative for fever.  HENT:  Negative for sore throat.   Eyes:  Negative for visual disturbance.  Respiratory:  Negative for shortness of breath.   Cardiovascular:  Negative for chest pain.  Gastrointestinal:  Negative for abdominal pain.  Genitourinary:  Positive for menstrual problem.   Musculoskeletal:  Negative for neck pain.  Skin:  Negative for rash.  Neurological:  Negative for headaches.  Psychiatric/Behavioral:  Negative for suicidal ideas.     Height '5\' 1"'$  (1.549 m), weight 72.6 kg. Physical Exam  Chaperone Chaperone: present  Constitutional *General Appearance: healthy-appearing, well-nourished, well-developed  Head Head: normocephalic  Neck *Thyroid: no enlargement, no nodules, non-tender  Lymph Nodes *Palpation: non-tender supraclavicular nodes, non-tender axillary nodes, non-tender inguinal nodes  Cardiovascular *Auscultation: RRR, no murmur  Lungs *Respiratory Effort: no accessory muscle usage, no intercostal retractions *Auscultation: clear to auscultation, no wheezing, no rales/crackles, no rhonchi  *Breast Bilateral: no skin changes, nipple appearance: normal, no abnormal nipple secretions, no tenderness, no masses palpable  Abdomen *Inspection/Palpation/Auscultation: non-distended, no tenderness, no rebound, no guarding, soft  Female Genitalia Vulva: no masses, no atrophy, no lesions Mons: normal Labia Majora: normal, no erythema, no excoriation, no atrophy, no discoloration, no lesions Labia Minora: normal, no erythema, no excoriation, no atrophy, no discoloration, no lesions Introitus: normal *Vagina: normal, no discharge, no blood present, no erythema, no atrophy, no lesions, no ulcers, no masses, no tenderness *Cervix: grossly normal, no lesions, no discharge, no bleeding, no cervical motion tenderness *Uterus: normal size, normal contour, midline, mobile, non-tender, anteverted *Urethral Meatus/ Urethra: normal meatus *Adnexa/Parametria: no mass palpable, no tenderness  Extremities Legs: no calf tenderness  Neurological System Impressions: motor: no deficits, sensory: no deficits  Psychiatric *Orientation: to person, to place, to time *Mood and Affect:  active and alert, normal mood, normal affect  No results found  for this or any previous visit (from the past 24 hour(s)).  No results found.  Assessment/Plan: 46Y P3 with AUB, submucosal fibroids - Plan: Sonata radiofrequency ablation, hysteroscopic myomectomy with Myosure, Mirena intrauterine device insertion - Reviewed consent form. Discussed risk of infection, bleeding, uterine perforation, damage to surrounding organs, failure to achieve desired result   Rowland Lathe 04/19/2022, 4:51 PM

## 2022-04-19 NOTE — Anesthesia Preprocedure Evaluation (Addendum)
Anesthesia Evaluation  Patient identified by MRN, date of birth, ID band Patient awake    Reviewed: Allergy & Precautions, NPO status , Patient's Chart, lab work & pertinent test results  History of Anesthesia Complications Negative for: history of anesthetic complications  Airway Mallampati: II   Neck ROM: Full    Dental no notable dental hx. (+) Dental Advisory Given   Pulmonary asthma ,    Pulmonary exam normal        Cardiovascular negative cardio ROS Normal cardiovascular exam     Neuro/Psych negative neurological ROS     GI/Hepatic negative GI ROS, Neg liver ROS,   Endo/Other  negative endocrine ROS  Renal/GU negative Renal ROS     Musculoskeletal negative musculoskeletal ROS (+)   Abdominal   Peds  Hematology  (+) Blood dyscrasia, anemia ,   Anesthesia Other Findings   Reproductive/Obstetrics                            Anesthesia Physical Anesthesia Plan  ASA: 2  Anesthesia Plan: General   Post-op Pain Management: Celebrex PO (pre-op)* and Tylenol PO (pre-op)*   Induction: Intravenous  PONV Risk Score and Plan: 4 or greater and Ondansetron, Dexamethasone, Midazolam and Scopolamine patch - Pre-op  Airway Management Planned: LMA and Oral ETT  Additional Equipment:   Intra-op Plan:   Post-operative Plan: Extubation in OR  Informed Consent: I have reviewed the patients History and Physical, chart, labs and discussed the procedure including the risks, benefits and alternatives for the proposed anesthesia with the patient or authorized representative who has indicated his/her understanding and acceptance.     Dental advisory given  Plan Discussed with: Anesthesiologist and CRNA  Anesthesia Plan Comments:       Anesthesia Quick Evaluation

## 2022-04-20 ENCOUNTER — Encounter (HOSPITAL_BASED_OUTPATIENT_CLINIC_OR_DEPARTMENT_OTHER): Payer: Self-pay | Admitting: Obstetrics and Gynecology

## 2022-04-20 ENCOUNTER — Other Ambulatory Visit: Payer: Self-pay

## 2022-04-20 ENCOUNTER — Ambulatory Visit (HOSPITAL_BASED_OUTPATIENT_CLINIC_OR_DEPARTMENT_OTHER): Payer: BC Managed Care – PPO | Admitting: Anesthesiology

## 2022-04-20 ENCOUNTER — Encounter (HOSPITAL_BASED_OUTPATIENT_CLINIC_OR_DEPARTMENT_OTHER)
Admission: RE | Disposition: A | Discharge status: Home / Self Care | Payer: Self-pay | Source: Home / Self Care | Attending: Obstetrics and Gynecology

## 2022-04-20 ENCOUNTER — Ambulatory Visit (HOSPITAL_BASED_OUTPATIENT_CLINIC_OR_DEPARTMENT_OTHER)
Admission: RE | Admit: 2022-04-20 | Discharge: 2022-04-20 | Disposition: A | Discharge status: Home / Self Care | Payer: BC Managed Care – PPO | Attending: Obstetrics and Gynecology | Admitting: Obstetrics and Gynecology

## 2022-04-20 DIAGNOSIS — N840 Polyp of corpus uteri: Secondary | ICD-10-CM | POA: Insufficient documentation

## 2022-04-20 DIAGNOSIS — J45909 Unspecified asthma, uncomplicated: Secondary | ICD-10-CM | POA: Insufficient documentation

## 2022-04-20 DIAGNOSIS — N939 Abnormal uterine and vaginal bleeding, unspecified: Secondary | ICD-10-CM | POA: Diagnosis not present

## 2022-04-20 DIAGNOSIS — D250 Submucous leiomyoma of uterus: Secondary | ICD-10-CM | POA: Diagnosis not present

## 2022-04-20 DIAGNOSIS — Z01818 Encounter for other preprocedural examination: Secondary | ICD-10-CM

## 2022-04-20 DIAGNOSIS — D259 Leiomyoma of uterus, unspecified: Secondary | ICD-10-CM | POA: Diagnosis not present

## 2022-04-20 DIAGNOSIS — Z30430 Encounter for insertion of intrauterine contraceptive device: Secondary | ICD-10-CM | POA: Diagnosis not present

## 2022-04-20 HISTORY — PX: INTRAUTERINE DEVICE (IUD) INSERTION: SHX5877

## 2022-04-20 HISTORY — DX: Anemia, unspecified: D64.9

## 2022-04-20 HISTORY — DX: COVID-19: U07.1

## 2022-04-20 HISTORY — PX: DILATATION & CURETTAGE/HYSTEROSCOPY WITH MYOSURE: SHX6511

## 2022-04-20 LAB — CBC WITH DIFFERENTIAL/PLATELET
Abs Immature Granulocytes: 0.01 10*3/uL (ref 0.00–0.07)
Basophils Absolute: 0 10*3/uL (ref 0.0–0.1)
Basophils Relative: 0 %
Eosinophils Absolute: 0.1 10*3/uL (ref 0.0–0.5)
Eosinophils Relative: 1 %
HCT: 38.8 % (ref 36.0–46.0)
Hemoglobin: 12.3 g/dL (ref 12.0–15.0)
Immature Granulocytes: 0 %
Lymphocytes Relative: 38 %
Lymphs Abs: 2.6 10*3/uL (ref 0.7–4.0)
MCH: 26.3 pg (ref 26.0–34.0)
MCHC: 31.7 g/dL (ref 30.0–36.0)
MCV: 83.1 fL (ref 80.0–100.0)
Monocytes Absolute: 0.5 10*3/uL (ref 0.1–1.0)
Monocytes Relative: 7 %
Neutro Abs: 3.6 10*3/uL (ref 1.7–7.7)
Neutrophils Relative %: 54 %
Platelets: 328 10*3/uL (ref 150–400)
RBC: 4.67 MIL/uL (ref 3.87–5.11)
RDW: 17 % — ABNORMAL HIGH (ref 11.5–15.5)
WBC: 6.9 10*3/uL (ref 4.0–10.5)
nRBC: 0 % (ref 0.0–0.2)

## 2022-04-20 LAB — POCT PREGNANCY, URINE: Preg Test, Ur: NEGATIVE

## 2022-04-20 LAB — TYPE AND SCREEN
ABO/RH(D): B POS
Antibody Screen: NEGATIVE

## 2022-04-20 SURGERY — DILATATION & CURETTAGE/HYSTEROSCOPY WITH MYOSURE
Anesthesia: General | Site: Vagina

## 2022-04-20 MED ORDER — FENTANYL CITRATE (PF) 100 MCG/2ML IJ SOLN: 25.0000 ug | INTRAMUSCULAR | Status: DC | PRN

## 2022-04-20 MED ORDER — LIDOCAINE 2% (20 MG/ML) 5 ML SYRINGE
INTRAMUSCULAR | Status: DC | PRN
  Administered 2022-04-20: 100 mg via INTRAVENOUS

## 2022-04-20 MED ORDER — ONDANSETRON HCL 4 MG/2ML IJ SOLN
INTRAMUSCULAR | Status: AC
  Filled 2022-04-20: qty 2

## 2022-04-20 MED ORDER — PROPOFOL 10 MG/ML IV BOLUS
INTRAVENOUS | Status: AC
  Filled 2022-04-20: qty 20

## 2022-04-20 MED ORDER — SCOPOLAMINE 1 MG/3DAYS TD PT72
MEDICATED_PATCH | TRANSDERMAL | Status: AC
  Filled 2022-04-20: qty 1

## 2022-04-20 MED ORDER — PROPOFOL 10 MG/ML IV BOLUS
INTRAVENOUS | Status: DC | PRN
  Administered 2022-04-20: 180 mg via INTRAVENOUS

## 2022-04-20 MED ORDER — CELECOXIB 200 MG PO CAPS
200.0000 mg | ORAL_CAPSULE | Freq: Once | ORAL | Status: AC
  Administered 2022-04-20: 200 mg via ORAL

## 2022-04-20 MED ORDER — SODIUM CHLORIDE 0.9 % IR SOLN
Status: DC | PRN
  Administered 2022-04-20: 6000 mL

## 2022-04-20 MED ORDER — DEXAMETHASONE SODIUM PHOSPHATE 10 MG/ML IJ SOLN
INTRAMUSCULAR | Status: DC | PRN
  Administered 2022-04-20: 10 mg via INTRAVENOUS

## 2022-04-20 MED ORDER — MIDAZOLAM HCL 2 MG/2ML IJ SOLN
INTRAMUSCULAR | Status: DC | PRN
  Administered 2022-04-20: 2 mg via INTRAVENOUS

## 2022-04-20 MED ORDER — LACTATED RINGERS IV SOLN: INTRAVENOUS | Status: DC

## 2022-04-20 MED ORDER — AMISULPRIDE (ANTIEMETIC) 5 MG/2ML IV SOLN: 10.0000 mg | Freq: Once | INTRAVENOUS | Status: DC | PRN

## 2022-04-20 MED ORDER — LIDOCAINE HCL (PF) 2 % IJ SOLN
INTRAMUSCULAR | Status: AC
  Filled 2022-04-20: qty 5

## 2022-04-20 MED ORDER — PROMETHAZINE HCL 25 MG/ML IJ SOLN: 6.2500 mg | INTRAMUSCULAR | Status: DC | PRN

## 2022-04-20 MED ORDER — LEVONORGESTREL 20 MCG/DAY IU IUD
INTRAUTERINE_SYSTEM | INTRAUTERINE | Status: AC
  Filled 2022-04-20: qty 1

## 2022-04-20 MED ORDER — TRANEXAMIC ACID-NACL 1000-0.7 MG/100ML-% IV SOLN
INTRAVENOUS | Status: AC
  Filled 2022-04-20: qty 100

## 2022-04-20 MED ORDER — STERILE WATER FOR IRRIGATION IR SOLN
Status: DC | PRN
  Administered 2022-04-20: 500 mL

## 2022-04-20 MED ORDER — LEVONORGESTREL 20 MCG/DAY IU IUD
1.0000 | INTRAUTERINE_SYSTEM | INTRAUTERINE | Status: AC
  Administered 2022-04-20: 1 via INTRAUTERINE

## 2022-04-20 MED ORDER — CELECOXIB 200 MG PO CAPS
ORAL_CAPSULE | ORAL | Status: AC
  Filled 2022-04-20: qty 1

## 2022-04-20 MED ORDER — ACETAMINOPHEN 500 MG PO TABS
ORAL_TABLET | ORAL | Status: AC
  Filled 2022-04-20: qty 2

## 2022-04-20 MED ORDER — SCOPOLAMINE 1 MG/3DAYS TD PT72
1.0000 | MEDICATED_PATCH | TRANSDERMAL | Status: DC
  Administered 2022-04-20: 1.5 mg via TRANSDERMAL

## 2022-04-20 MED ORDER — ACETAMINOPHEN 500 MG PO TABS: 1000.0000 mg | ORAL_TABLET | Freq: Once | ORAL | Status: DC

## 2022-04-20 MED ORDER — DEXAMETHASONE SODIUM PHOSPHATE 10 MG/ML IJ SOLN
INTRAMUSCULAR | Status: AC
  Filled 2022-04-20: qty 1

## 2022-04-20 MED ORDER — KETOROLAC TROMETHAMINE 30 MG/ML IJ SOLN
INTRAMUSCULAR | Status: AC
  Filled 2022-04-20: qty 1

## 2022-04-20 MED ORDER — ACETAMINOPHEN 325 MG PO TABS: 650.0000 mg | ORAL_TABLET | Freq: Four times a day (QID) | ORAL | Status: DC | PRN

## 2022-04-20 MED ORDER — FENTANYL CITRATE (PF) 100 MCG/2ML IJ SOLN
INTRAMUSCULAR | Status: DC | PRN
Start: 2022-04-20 — End: 2022-04-20
  Administered 2022-04-20 (×2): 25 ug via INTRAVENOUS
  Administered 2022-04-20: 50 ug via INTRAVENOUS

## 2022-04-20 MED ORDER — KETOROLAC TROMETHAMINE 15 MG/ML IJ SOLN
INTRAMUSCULAR | Status: DC | PRN
  Administered 2022-04-20: 15 mg via INTRAVENOUS

## 2022-04-20 MED ORDER — TRAMADOL HCL 50 MG PO TABS
50.0000 mg | ORAL_TABLET | Freq: Once | ORAL | Status: AC
  Administered 2022-04-20: 50 mg via ORAL

## 2022-04-20 MED ORDER — IBUPROFEN 200 MG PO TABS: 600.0000 mg | ORAL_TABLET | Freq: Four times a day (QID) | ORAL | Status: DC | PRN

## 2022-04-20 MED ORDER — EPHEDRINE 5 MG/ML INJ
INTRAVENOUS | Status: AC
  Filled 2022-04-20: qty 5

## 2022-04-20 MED ORDER — TRANEXAMIC ACID 650 MG PO TABS: 1300.0000 mg | ORAL_TABLET | Freq: Three times a day (TID) | ORAL | 0 refills | Status: DC

## 2022-04-20 MED ORDER — LIDOCAINE HCL 1 % IJ SOLN
INTRAMUSCULAR | Status: DC | PRN
  Administered 2022-04-20: 10 mL

## 2022-04-20 MED ORDER — TRAMADOL HCL 50 MG PO TABS
ORAL_TABLET | ORAL | Status: AC
  Filled 2022-04-20: qty 1

## 2022-04-20 MED ORDER — MIDAZOLAM HCL 2 MG/2ML IJ SOLN
INTRAMUSCULAR | Status: AC
  Filled 2022-04-20: qty 2

## 2022-04-20 MED ORDER — ACETAMINOPHEN 500 MG PO TABS
1000.0000 mg | ORAL_TABLET | ORAL | Status: AC
  Administered 2022-04-20: 1000 mg via ORAL

## 2022-04-20 MED ORDER — ONDANSETRON HCL 4 MG/2ML IJ SOLN
INTRAMUSCULAR | Status: DC | PRN
  Administered 2022-04-20: 4 mg via INTRAVENOUS

## 2022-04-20 MED ORDER — TRANEXAMIC ACID-NACL 1000-0.7 MG/100ML-% IV SOLN
1000.0000 mg | Freq: Once | INTRAVENOUS | Status: AC
  Administered 2022-04-20: 1000 mg via INTRAVENOUS

## 2022-04-20 MED ORDER — FENTANYL CITRATE (PF) 100 MCG/2ML IJ SOLN
INTRAMUSCULAR | Status: AC
  Filled 2022-04-20: qty 2

## 2022-04-20 SURGICAL SUPPLY — 34 items
CATH ROBINSON RED A/P 16FR (CATHETERS) ×2 IMPLANT
DEVICE MYOSURE LITE (MISCELLANEOUS) IMPLANT
DEVICE MYOSURE REACH (MISCELLANEOUS) IMPLANT
DRSG TELFA 3X8 NADH (GAUZE/BANDAGES/DRESSINGS) IMPLANT
ELECT DISPERSIVE SONATA (ELECTRODE) ×6 IMPLANT
ELECT REM PT RETURN 9FT ADLT (ELECTROSURGICAL)
ELECTRODE REM PT RTRN 9FT ADLT (ELECTROSURGICAL) ×2 IMPLANT
GAUZE 4X4 16PLY ~~LOC~~+RFID DBL (SPONGE) ×2 IMPLANT
GLOVE BIO SURGEON STRL SZ 6 (GLOVE) ×3 IMPLANT
GLOVE BIOGEL PI IND STRL 6.5 (GLOVE) ×2 IMPLANT
GLOVE BIOGEL PI IND STRL 7.0 (GLOVE) ×2 IMPLANT
GLOVE BIOGEL PI INDICATOR 6.5 (GLOVE) ×1
GLOVE BIOGEL PI INDICATOR 7.0 (GLOVE) ×1
GLOVE ECLIPSE 6.5 STRL STRAW (GLOVE) ×3 IMPLANT
GOWN STRL REUS W/ TWL LRG LVL3 (GOWN DISPOSABLE) ×2 IMPLANT
GOWN STRL REUS W/TWL LRG LVL3 (GOWN DISPOSABLE) ×5 IMPLANT
HANDPIECE RFA SONATA (MISCELLANEOUS) ×3 IMPLANT
IV NS IRRIG 3000ML ARTHROMATIC (IV SOLUTION) ×3 IMPLANT
KIT PROCEDURE FLUENT (KITS) ×3 IMPLANT
KIT TURNOVER CYSTO (KITS) ×3 IMPLANT
MYOSURE XL FIBROID (MISCELLANEOUS) ×2
Mirena Levonorgestrel-releasing intrauterine syste ×1 IMPLANT
PACK VAGINAL MINOR WOMEN LF (CUSTOM PROCEDURE TRAY) ×3 IMPLANT
PAD DRESSING TELFA 3X8 NADH (GAUZE/BANDAGES/DRESSINGS) ×2 IMPLANT
PAD OB MATERNITY 4.3X12.25 (PERSONAL CARE ITEMS) ×3 IMPLANT
PAD PREP 24X48 CUFFED NSTRL (MISCELLANEOUS) ×3 IMPLANT
SEAL CERVICAL OMNI LOK (ABLATOR) ×1 IMPLANT
SEAL ROD LENS SCOPE MYOSURE (ABLATOR) ×3 IMPLANT
SOL PREP POV-IOD 4OZ 10% (MISCELLANEOUS) ×1 IMPLANT
SYR 50ML LL SCALE MARK (SYRINGE) ×3 IMPLANT
SYSTEM TISS REMOVAL MYOSURE XL (MISCELLANEOUS) IMPLANT
TOWEL OR 17X26 10 PK STRL BLUE (TOWEL DISPOSABLE) ×3 IMPLANT
UNDERPAD 30X36 HEAVY ABSORB (UNDERPADS AND DIAPERS) ×2 IMPLANT
WATER STERILE IRR 500ML POUR (IV SOLUTION) ×3 IMPLANT

## 2022-04-20 NOTE — Anesthesia Postprocedure Evaluation (Signed)
Anesthesia Post Note  Patient: Brittney Steele  Procedure(s) Performed: DILATATION & CURETTAGE/HYSTEROSCOPY WITH MYOSURE (Vagina ) Radio Frequency Ablation with Sonata (Vagina ) INTRAUTERINE DEVICE (IUD) INSERTION (Vagina )     Patient location during evaluation: PACU Anesthesia Type: General Level of consciousness: sedated Pain management: pain level controlled Vital Signs Assessment: post-procedure vital signs reviewed and stable Respiratory status: spontaneous breathing and respiratory function stable Cardiovascular status: stable Postop Assessment: no apparent nausea or vomiting Anesthetic complications: no   No notable events documented.  Last Vitals:  Vitals:   04/20/22 1145 04/20/22 1200  BP: 122/78 129/78  Pulse: 70 68  Resp: 17 17  Temp:    SpO2: 100% 99%    Last Pain:  Vitals:   04/20/22 1145  TempSrc:   PainSc: 0-No pain                 Jamerson Vonbargen DANIEL

## 2022-04-20 NOTE — Op Note (Signed)
OPERATIVE NOTE  04/20/2022   11:09 AM   PATIENT:  Brittney Steele  46 y.o. female   PRE-OPERATIVE DIAGNOSIS:  abnormal uterine bleeding, leiomyoma  POST-OPERATIVE DIAGNOSIS:  abnormal uterine bleeding, leiomyoma    PROCEDURE:  Hysterscopic myomectomy, Sonata radiofrequency ablation, Mirena intrauterine device insertion   SURGEON:  Surgeon(s) and Role:    * Derren Suydam, Edwinna Areola, MD - Primary    ANESTHESIA:   local and general   EBL:  29m   BLOOD ADMINISTERED:none   DRAINS: none    LOCAL MEDICATIONS USED:  LIDOCAINE  and Amount: 10 ml   SPECIMEN:  Source of Specimen:  Uterine leiomyoma and endometrial curettings   DISPOSITION OF SPECIMEN:  PATHOLOGY   COUNTS:  YES   PLAN OF CARE: Discharge to home after PACU   PATIENT DISPOSITION:  PACU - hemodynamically stable.   Delay start of Pharmacological VTE agent (>24hrs) due to surgical blood loss or risk of bleeding: not applicable  FINDINGS: Anteverted uterus sounded to 8.5cm.  On hysterscopic view, bilateral ostia visualized. 3cm anterior type 1 fibroid >50% submucosal. Otherwise thin appearing endometrium. On ultrasound view, 1.5cm posterior subserosal fibroid and 1.5cm anterior intramural fibroid.  Total fluid deficit 234109m  PROCEDURE IN DETAIL:  The patient was appropriately consented and taken to the operating room where anesthesia was administered without difficulty. Thromboguards were placed and connected. She was placed in the dorsal lithotomy position in stirrups. The patient was then prepped and draped in normal sterile fashion. A long speculum was inserted into the vagina. A single-tooth tenaculum was used to grasp the anterior lip of the cervix. A paracervical block was obtained by injecting a total of 1054mf 1% lidocaine at the 4 and 8 o'clock positions at the cervicovaginal junction. The uterus was sounded to 8.5cm. The cervical os was sequentially dilated using Pratt dilators to accommodate the hysteroscope.  The hysteroscope was introduced under direct visualization and the uterus was distended with normal saline. Findings as noted above. The hysteroscope was removed.  The Sonata treatment device with integrated ultrasound and radiofrequency energy delivery system was inserted into the uterine cavity. Global assessment was performed the 3 myomas.  The right 3 cm type 1 fibroid was treated in 3 sections. 9o'clock, right mid: 3.5x2.6cm (3:54) 9o'clock, right fundal: 3.1x2.2cm (3:06) 12o'clock right mid: 3.5x2.6cm (3:54)  The anterior 1.5cm type 3 fibroid was treated in 1 section. 12o'clock, anterior mid: 2.9x2.2 (2:54)  The posterior 1.5cm fibroid could not be treated in an appropriate safety zone so was not ablated.   Each ablation was performed in the following manner:  A graphical overlay was placed to target the ablation zone. The trocar tip was introduced. Care was taken to ensure that the guide was within the serosal boundary and needle electrodes were deployed. A safety check was again performed. Radiofrequency ablation was then performed in each section for the above noted time. The Sonata handpiece was removed.   The hysteroscope was replaced. Blood clots were noted in the cavity, then cleared by hysteroscope fluid. The Myosure XL device was used to resect the portion of the fibroid that was extending into the cavity. This took approximately 90 mins due to size of submucosal portion of fibroid. Specimen was collected for pathology. Hemostasis was noted in the uterine cavity. The hysteroscope was removed. The Mirena IUD was inserted and strings trimmed to 3 cm beyond the external cervical os. The tenaculum was removed from the cervix and good hemostasis was noted at the puncture sites.  The patient tolerated the procedure well. The instrument and sponge counts were correct times two. The patient was awakened from anesthesia and taken to the recovery room in stable condition.  Irene Pap,  MD 04/20/22 11:11 AM

## 2022-04-20 NOTE — Brief Op Note (Signed)
04/20/2022  11:09 AM  PATIENT:  Brittney Steele  46 y.o. female  PRE-OPERATIVE DIAGNOSIS:  uterine fibroids, menorrhagia  POST-OPERATIVE DIAGNOSIS:  uterine fibroids, menorrhagia  PROCEDURE:  Hysterscopic myomectomy, Sonata radiofrequency ablation, Mirena intrauterine device insertion  SURGEON:  Surgeon(s) and Role:    * Kortland Nichols, Edwinna Areola, MD - Primary    ANESTHESIA:   local and general  EBL:  39m  BLOOD ADMINISTERED:none  DRAINS: none   LOCAL MEDICATIONS USED:  LIDOCAINE  and Amount: 10 ml  SPECIMEN:  Source of Specimen:  Uterine leiomyoma and endometrial curetting   DISPOSITION OF SPECIMEN:  PATHOLOGY  COUNTS:  YES  TOURNIQUET:  * No tourniquets in log *  DICTATION: .Note written in EPIC  PLAN OF CARE: Discharge to home after PACU  PATIENT DISPOSITION:  PACU - hemodynamically stable.   Delay start of Pharmacological VTE agent (>24hrs) due to surgical blood loss or risk of bleeding: not applicable

## 2022-04-20 NOTE — Progress Notes (Signed)
Dr. Irene Pap instructed patient to take norethindrone 2 tabs tonight then stop it, and begin lysteda 2 tablets 3 times per day.  These instructions written on discharge instructions and patient and her spouse verbalized understanding.

## 2022-04-20 NOTE — Transfer of Care (Signed)
Immediate Anesthesia Transfer of Care Note  Patient: Brittney Steele  Procedure(s) Performed: Procedure(s) (LRB): DILATATION & CURETTAGE/HYSTEROSCOPY WITH MYOSURE (N/A) Radio Frequency Ablation with Sonata (N/A) INTRAUTERINE DEVICE (IUD) INSERTION (N/A)  Patient Location: PACU  Anesthesia Type: General  Level of Consciousness: awake, oriented, sedated and patient cooperative  Airway & Oxygen Therapy: Patient Spontanous Breathing and Patient connected to nasal cannula oxygen  Post-op Assessment: Report given to PACU RN and Post -op Vital signs reviewed and stable  Post vital signs: Reviewed and stable  Complications: No apparent anesthesia complications  Last Vitals:  Vitals Value Taken Time  BP 127/74 04/20/22 1118  Temp    Pulse 81 04/20/22 1122  Resp 14 04/20/22 1122  SpO2 100 % 04/20/22 1122  Vitals shown include unvalidated device data.  Last Pain:  Vitals:   04/20/22 0702  TempSrc: Oral  PainSc: 0-No pain      Patients Stated Pain Goal: 4 (92/92/44 6286)  Complications: No notable events documented.

## 2022-04-20 NOTE — Anesthesia Procedure Notes (Signed)
Procedure Name: LMA Insertion Date/Time: 04/20/2022 8:41 AM  Performed by: Suan Halter, CRNAPre-anesthesia Checklist: Patient identified, Emergency Drugs available, Suction available and Patient being monitored Patient Re-evaluated:Patient Re-evaluated prior to induction Oxygen Delivery Method: Circle system utilized Preoxygenation: Pre-oxygenation with 100% oxygen Induction Type: IV induction Ventilation: Mask ventilation without difficulty LMA: LMA inserted LMA Size: 4.0 Number of attempts: 1 Airway Equipment and Method: Bite block Placement Confirmation: positive ETCO2 Tube secured with: Tape Dental Injury: Teeth and Oropharynx as per pre-operative assessment

## 2022-04-20 NOTE — Discharge Instructions (Signed)
  Post Anesthesia Home Care Instructions  Activity: Get plenty of rest for the remainder of the day. A responsible individual must stay with you for 24 hours following the procedure.  For the next 24 hours, DO NOT: -Drive a car -Paediatric nurse -Drink alcoholic beverages -Take any medication unless instructed by your physician -Make any legal decisions or sign important papers.  Meals: Start with liquid foods such as gelatin or soup. Progress to regular foods as tolerated. Avoid greasy, spicy, heavy foods. If nausea and/or vomiting occur, drink only clear liquids until the nausea and/or vomiting subsides. Call your physician if vomiting continues.  Special Instructions/Symptoms: Your throat may feel dry or sore from the anesthesia or the breathing tube placed in your throat during surgery. If this causes discomfort, gargle with warm salt water. The discomfort should disappear within 24 hours.  If you had a scopolamine patch placed behind your ear for the management of post- operative nausea and/or vomiting:  1. The medication in the patch is effective for 72 hours, after which it should be removed.  Wrap patch in a tissue and discard in the trash. Wash hands thoroughly with soap and water. 2. You may remove the patch earlier than 72 hours if you experience unpleasant side effects which may include dry mouth, dizziness or visual disturbances. 3. Avoid touching the patch. Wash your hands with soap and water after contact with the patch.     Remove patch behind let ear by Sunday, May 04, 2022.  DISCHARGE INSTRUCTIONS: HYSTEROSCOPY / ENDOMETRIAL ABLATION The following instructions have been prepared to help you care for yourself upon your return home.  May take stool softner while taking narcotic pain medication to prevent constipation.  Drink plenty of water.  Personal hygiene:  Use sanitary pads for vaginal drainage, not tampons.  Shower the day after your procedure.  NO tub baths,  pools or Jacuzzis for 2-3 weeks.  Wipe front to back after using the bathroom.  Activity and limitations:  Do NOT drive or operate any equipment for 24 hours. The effects of anesthesia are still present and drowsiness may result.  Do NOT rest in bed all day.  Walking is encouraged.  Walk up and down stairs slowly.  You may resume your normal activity in one to two days or as indicated by your physician. Sexual activity: NO intercourse for at least 2 weeks after the procedure, or as indicated by your Doctor.  Diet: Eat a light meal as desired this evening. You may resume your usual diet tomorrow.  Return to Work: You may resume your work activities in one to two days or as indicated by Marine scientist.  What to expect after your surgery: Expect to have vaginal bleeding/discharge for 2-3 days and spotting for up to 10 days. It is not unusual to have soreness for up to 1-2 weeks. You may have a slight burning sensation when you urinate for the first day. Mild cramps may continue for a couple of days. You may have a regular period in 2-6 weeks.  Call your doctor for any of the following:  Excessive vaginal bleeding or clotting, saturating and changing one pad every hour.  Inability to urinate 6 hours after discharge from hospital.  Pain not relieved by pain medication.  Fever of 100.4 F or greater.  Unusual vaginal discharge or odor.

## 2022-04-21 ENCOUNTER — Encounter (HOSPITAL_BASED_OUTPATIENT_CLINIC_OR_DEPARTMENT_OTHER): Payer: Self-pay | Admitting: Obstetrics and Gynecology

## 2022-04-21 LAB — SURGICAL PATHOLOGY

## 2022-06-17 DIAGNOSIS — R300 Dysuria: Secondary | ICD-10-CM | POA: Diagnosis not present

## 2022-06-17 DIAGNOSIS — R350 Frequency of micturition: Secondary | ICD-10-CM | POA: Diagnosis not present

## 2022-07-21 DIAGNOSIS — Z6829 Body mass index (BMI) 29.0-29.9, adult: Secondary | ICD-10-CM | POA: Diagnosis not present

## 2022-07-21 DIAGNOSIS — Z01419 Encounter for gynecological examination (general) (routine) without abnormal findings: Secondary | ICD-10-CM | POA: Diagnosis not present

## 2022-07-21 DIAGNOSIS — Z1231 Encounter for screening mammogram for malignant neoplasm of breast: Secondary | ICD-10-CM | POA: Diagnosis not present

## 2022-09-29 ENCOUNTER — Telehealth: Payer: Self-pay | Admitting: Nurse Practitioner

## 2022-09-29 ENCOUNTER — Encounter: Payer: Self-pay | Admitting: Nurse Practitioner

## 2022-09-29 NOTE — Telephone Encounter (Signed)
I dont see where there is an access nurse note in here. Make sure patient has ED and UC precautions

## 2022-09-29 NOTE — Telephone Encounter (Signed)
Noted  

## 2022-09-29 NOTE — Telephone Encounter (Signed)
Patient call and stated she has been experiencing lightheadedness since the morning and her iron levels are low been trying to eat but hasn't helped. Patient was sent to access nurse.

## 2022-09-29 NOTE — Telephone Encounter (Signed)
    [facility] - Client TELEPHONE ADVICE RECORD AccessNurse Patient Name: Brittney Steele Gender: Female DOB: [birth year] Age: 46 Y 35 M 5 D Return Phone Number: [phone] (Primary), [phone] (Secondary) Address: City/ State/ Zip: [place]  [address] Client [facility] - Client Client Site [place] - Day Provider Romilda Garret- NP Contact Type Call Who Is Calling Patient / Member / Family / Caregiver Call Type Triage / Clinical Relationship To Patient Self Return Phone Number [phone] (Primary) Chief Complaint Blood Sugar Low Reason for Call Symptomatic / Request for [name] states she is lightheadedness all day, iron low but eating has not been enough. Her blood sugar was 74 when last checked a week ago go. Translation No Nurse Assessment Nurse: Lavina Hamman, RN, Thomasena Edis Date/Time (Eastern Time): 09/29/2022 3:39:24 PM Confirm and document reason for call. If symptomatic, describe symptoms. ---Caller states current BS 37 and is feeling fatigued. No history of diabetes. Has history of low iron levels. Does the patient have any new or worsening symptoms? ---Yes Will a triage be completed? ---Yes Related visit to physician within the last 2 weeks? ---No Does the PT have any chronic conditions? (i.e. diabetes, asthma, this includes High risk factors for pregnancy, etc.) ---No Is the patient pregnant or possibly pregnant? (Ask all females between the ages of 15-55) ---No Is this a behavioral health or substance abuse call? ---No Guidelines Guideline Title Affirmed Question Affirmed Notes Nurse Date/Time (Eastern Time) Weakness (Generalized) and Fatigue [1] MODERATE weakness (i.e., interferes with work, school, normal activities) AND [2] persists > 3 days Lavina Hamman, Therapist, sports, Thomasena Edis 09/29/2022 3:40:36 PM Disp. Time Eilene Ghazi Time) Disposition Final  User 09/29/2022 3:44:00 PM See PCP within 24 Hours Yes Lavina Hamman, RN, Thomasena Edis PLEASE NOTE: All timestamps contained within this report are represented as [place] Standard Time. CONFIDENTIALTY NOTICE: This fax transmission is intended only for the addressee. It contains information that is legally privileged, confidential or otherwise protected from use or disclosure. If you are not the intended recipient, you are strictly prohibited from reviewing, disclosing, copying using or disseminating any of this information or taking any action in reliance on or regarding this information. If you have received this fax in error, please notify us immediately by telephone so that we can arrange for its return to [place]. Phone: [phone], Toll-Free: [phone], Fax: [phone] Page: 2 of 2 Call Id: [id number] Final Disposition 09/29/2022 3:44:00 PM See PCP within 24 Hours Yes Lavina Hamman, RN, Thomasena Edis Caller Disagree/Comply Comply Caller Understands Yes PreDisposition InappropriateToAsk Care Advice Given Per Guideline * IF OFFICE WILL BE OPEN: You need to be examined within the next 24 hours. Call your doctor (or NP/PA) when the office opens and make an appointment. SEE PCP WITHIN 24 HOURS: CARE ADVICE given per Weakness and Fatigue (Adult) guideline. * You become worse CALL BACK IF:

## 2022-09-29 NOTE — Telephone Encounter (Signed)
Patient called in after talking to access nurse,they advised her to be seen in the next 24 hours. She stated that she is okay with being seen on Tuesday 10/04/22,because she has dealt with this issue before.

## 2022-09-30 NOTE — Telephone Encounter (Signed)
Noted. If she needs to be seen before UC or ED

## 2022-10-04 ENCOUNTER — Encounter: Payer: Self-pay | Admitting: Nurse Practitioner

## 2022-10-04 ENCOUNTER — Ambulatory Visit (INDEPENDENT_AMBULATORY_CARE_PROVIDER_SITE_OTHER): Payer: BC Managed Care – PPO | Admitting: Nurse Practitioner

## 2022-10-04 VITALS — BP 117/86 | HR 90 | Temp 98.0°F | Resp 16 | Ht 67.0 in | Wt 158.5 lb

## 2022-10-04 DIAGNOSIS — R42 Dizziness and giddiness: Secondary | ICD-10-CM | POA: Diagnosis not present

## 2022-10-04 DIAGNOSIS — Z862 Personal history of diseases of the blood and blood-forming organs and certain disorders involving the immune mechanism: Secondary | ICD-10-CM | POA: Diagnosis not present

## 2022-10-04 DIAGNOSIS — E538 Deficiency of other specified B group vitamins: Secondary | ICD-10-CM

## 2022-10-04 DIAGNOSIS — L659 Nonscarring hair loss, unspecified: Secondary | ICD-10-CM

## 2022-10-04 DIAGNOSIS — L609 Nail disorder, unspecified: Secondary | ICD-10-CM | POA: Insufficient documentation

## 2022-10-04 LAB — IBC + FERRITIN
Ferritin: 37.6 ng/mL (ref 10.0–291.0)
Iron: 141 ug/dL (ref 42–145)
Saturation Ratios: 44.6 % (ref 20.0–50.0)
TIBC: 316.4 ug/dL (ref 250.0–450.0)
Transferrin: 226 mg/dL (ref 212.0–360.0)

## 2022-10-04 LAB — CBC
HCT: 38.6 % (ref 36.0–46.0)
Hemoglobin: 12.7 g/dL (ref 12.0–15.0)
MCHC: 33 g/dL (ref 30.0–36.0)
MCV: 85.3 fl (ref 78.0–100.0)
Platelets: 243 10*3/uL (ref 150.0–400.0)
RBC: 4.52 Mil/uL (ref 3.87–5.11)
RDW: 13.4 % (ref 11.5–15.5)
WBC: 6.4 10*3/uL (ref 4.0–10.5)

## 2022-10-04 LAB — COMPREHENSIVE METABOLIC PANEL
ALT: 10 U/L (ref 0–35)
AST: 14 U/L (ref 0–37)
Albumin: 4.3 g/dL (ref 3.5–5.2)
Alkaline Phosphatase: 64 U/L (ref 39–117)
BUN: 10 mg/dL (ref 6–23)
CO2: 29 mEq/L (ref 19–32)
Calcium: 9.1 mg/dL (ref 8.4–10.5)
Chloride: 105 mEq/L (ref 96–112)
Creatinine, Ser: 0.95 mg/dL (ref 0.40–1.20)
GFR: 72.77 mL/min (ref >60.00)
Glucose, Bld: 77 mg/dL (ref 70–99)
Potassium: 4.2 mEq/L (ref 3.5–5.1)
Sodium: 140 mEq/L (ref 135–145)
Total Bilirubin: 0.4 mg/dL (ref 0.2–1.2)
Total Protein: 6.6 g/dL (ref 6.0–8.3)

## 2022-10-04 LAB — TSH: TSH: 1 u[IU]/mL (ref 0.35–5.50)

## 2022-10-04 LAB — VITAMIN B12: Vitamin B-12: 310 pg/mL (ref 211–911)

## 2022-10-04 NOTE — Progress Notes (Signed)
Established Patient Office Visit  Subjective   Patient ID: Brittney Steele, female    DOB: [birth year]  Age: 46 y.o. MRN: [id number]  Chief Complaint  Patient presents with   Dizziness    X come and go for 2 weeks.   Hair/Scalp Problem   Nail Problem    Turning black    HPI  Patient has a history of low hemoglobin that was related to menstrual bleeding and a uterine lesion to myoma.  Patient did undergo D&C with Dr. Duane Boston on 04/20/2022.  Dizziness: States that it has been going it has been two weeks. States it is a lightheadedness. States that it is intermittent. Around the same time everyday. 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 States that she will eat breakfast as of late. States that she had a V8 energy drink, not a full breakfast. Light yellow color. V8 energy, celcisus and water. Will also have caffiene. States that she is still taking the iron supplements. States that she is takin 4 tables of ferrous sulfate daily   Hair: States that she is taking biotin for a week after her noticing hair falling out. States that she used head and shoulder weekly.  She has noticed clumps coming out of the shower and on her brush but nothing to the extent of her waking up and being on her pillows or bedding.  Nail problem: Right foot 4th digit. No itching, burning, discharge. States no injry. States she happened to see it approx 2 weeks ago.  Black discoloration on the lateral side of nail.    Review of Systems  Constitutional:  Negative for chills and fever.  Gastrointestinal:        Bm daily   Neurological:  Positive for dizziness and headaches. Negative for tingling.      Objective:     BP 117/86 (Patient Position: Standing)   Pulse 90   Temp 98 F (36.7 C)   Resp 16   Ht '5\' 7"'$  (1.702 m)   Wt 158 lb 8 oz (71.9 kg)   LMP 10/04/2022   SpO2 98%   BMI 24.82 kg/m  BP Readings from Last 3 Encounters:  10/04/22 117/86   04/20/22 112/71  02/09/22 124/62   Wt Readings from Last 3 Encounters:  10/04/22 158 lb 8 oz (71.9 kg)  04/20/22 172 lb (78 kg)  02/09/22 168 lb 2 oz (76.3 kg)      Physical Exam Vitals and nursing note reviewed.  Constitutional:      Appearance: Normal appearance.  HENT:     Right Ear: Tympanic membrane, ear canal and external ear normal.     Left Ear: Tympanic membrane, ear canal and external ear normal.     Mouth/Throat:     Mouth: Mucous membranes are moist.     Pharynx: Oropharynx is clear.  Eyes:     Extraocular Movements: Extraocular movements intact.     Pupils: Pupils are equal, round, and reactive to light.  Cardiovascular:     Rate and Rhythm: Normal rate and regular rhythm.     Heart sounds: Normal heart sounds.  Pulmonary:     Effort: Pulmonary effort is normal.     Breath sounds: Normal breath sounds.  Musculoskeletal:     Right lower leg: No edema.     Left lower leg: No edema.       Feet:  Feet:     Comments: Right fourth  toe toenail does feel rigid and flaky Lymphadenopathy:     Cervical: No cervical adenopathy.  Skin:    General: Skin is warm.  Neurological:     General: No focal deficit present.     Mental Status: She is alert.     Deep Tendon Reflexes:     Reflex Scores:      Bicep reflexes are 2+ on the right side and 2+ on the left side.      Patellar reflexes are 2+ on the right side and 2+ on the left side.    Comments: Bilateral upper and lower extremity strength 5/5      No results found for any visits on 10/04/22.    The 10-year ASCVD risk score (Arnett DK, et al., 2019) is: 0.4%    Assessment & Plan:   Problem List Items Addressed This Visit       Other   Nail problem - Primary    Ambiguous in nature just to 1 toenail.  Pending labs if normal consider referral to podiatry      Lightheadedness    Encourage adequate hydration.  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  Pending  labs today      Relevant Orders   CBC   Comprehensive metabolic panel   TSH   History of anemia    History of anemia secondary to several uterine fibroids and heavy bleeding.  Patient has a D and C along with IUD placed.  No longer having heavy periods she is taking four 325 mg ferrous sulfate daily encourage patient to stop that until blood work returns      Relevant Orders   IBC + Ferritin   Vitamin B12 deficiency    Pending recheck today.      Relevant Orders   Vitamin B12   Hair loss    Unclear etiology.  Patient is taking biotin over-the-counter supplementation.  Uses head and shoulders once weekly.  Pending lab results      Relevant Orders   TSH    Return if symptoms worsen or fail to improve.    Romilda Garret, NP

## 2022-10-04 NOTE — Assessment & Plan Note (Signed)
Ambiguous in nature just to 1 toenail.  Pending labs if normal consider referral to podiatry

## 2022-10-04 NOTE — Assessment & Plan Note (Signed)
Pending recheck today.

## 2022-10-04 NOTE — Patient Instructions (Signed)
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

## 2022-10-04 NOTE — Assessment & Plan Note (Signed)
History of anemia secondary to several uterine fibroids and heavy bleeding.  Patient has a D and C along with IUD placed.  No longer having heavy periods she is taking four 325 mg ferrous sulfate daily encourage patient to stop that until blood work returns

## 2022-10-04 NOTE — Assessment & Plan Note (Signed)
Encourage adequate hydration.  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  Pending labs today

## 2022-10-04 NOTE — Telephone Encounter (Signed)
error 

## 2022-10-04 NOTE — Assessment & Plan Note (Signed)
Unclear etiology.  Patient is taking biotin over-the-counter supplementation.  Uses head and shoulders once weekly.  Pending lab results

## 2022-10-05 ENCOUNTER — Telehealth: Payer: Self-pay | Admitting: Nurse Practitioner

## 2022-10-05 DIAGNOSIS — L659 Nonscarring hair loss, unspecified: Secondary | ICD-10-CM

## 2022-10-05 NOTE — Telephone Encounter (Signed)
Referral placed.

## 2022-10-05 NOTE — Telephone Encounter (Signed)
-----   Message from Barkley Bruns, [place] sent at 10/05/2022  8:45 AM EST ----- Called patient reviewed all information and repeated back to me. Will call if any questions.  Pt would like a referral to dermatologist, but is willing to wait for the foot dr. She said she would give it some time to see if it grows out.

## 2023-02-09 IMAGING — DX DG CHEST 2V
2 series · 2 of 2 positions shown · non-contrast
Comparison: None.

CLINICAL DATA: Cough.  Dyspnea on exertion.

EXAM:
CHEST - 2 VIEW

[chest pa]
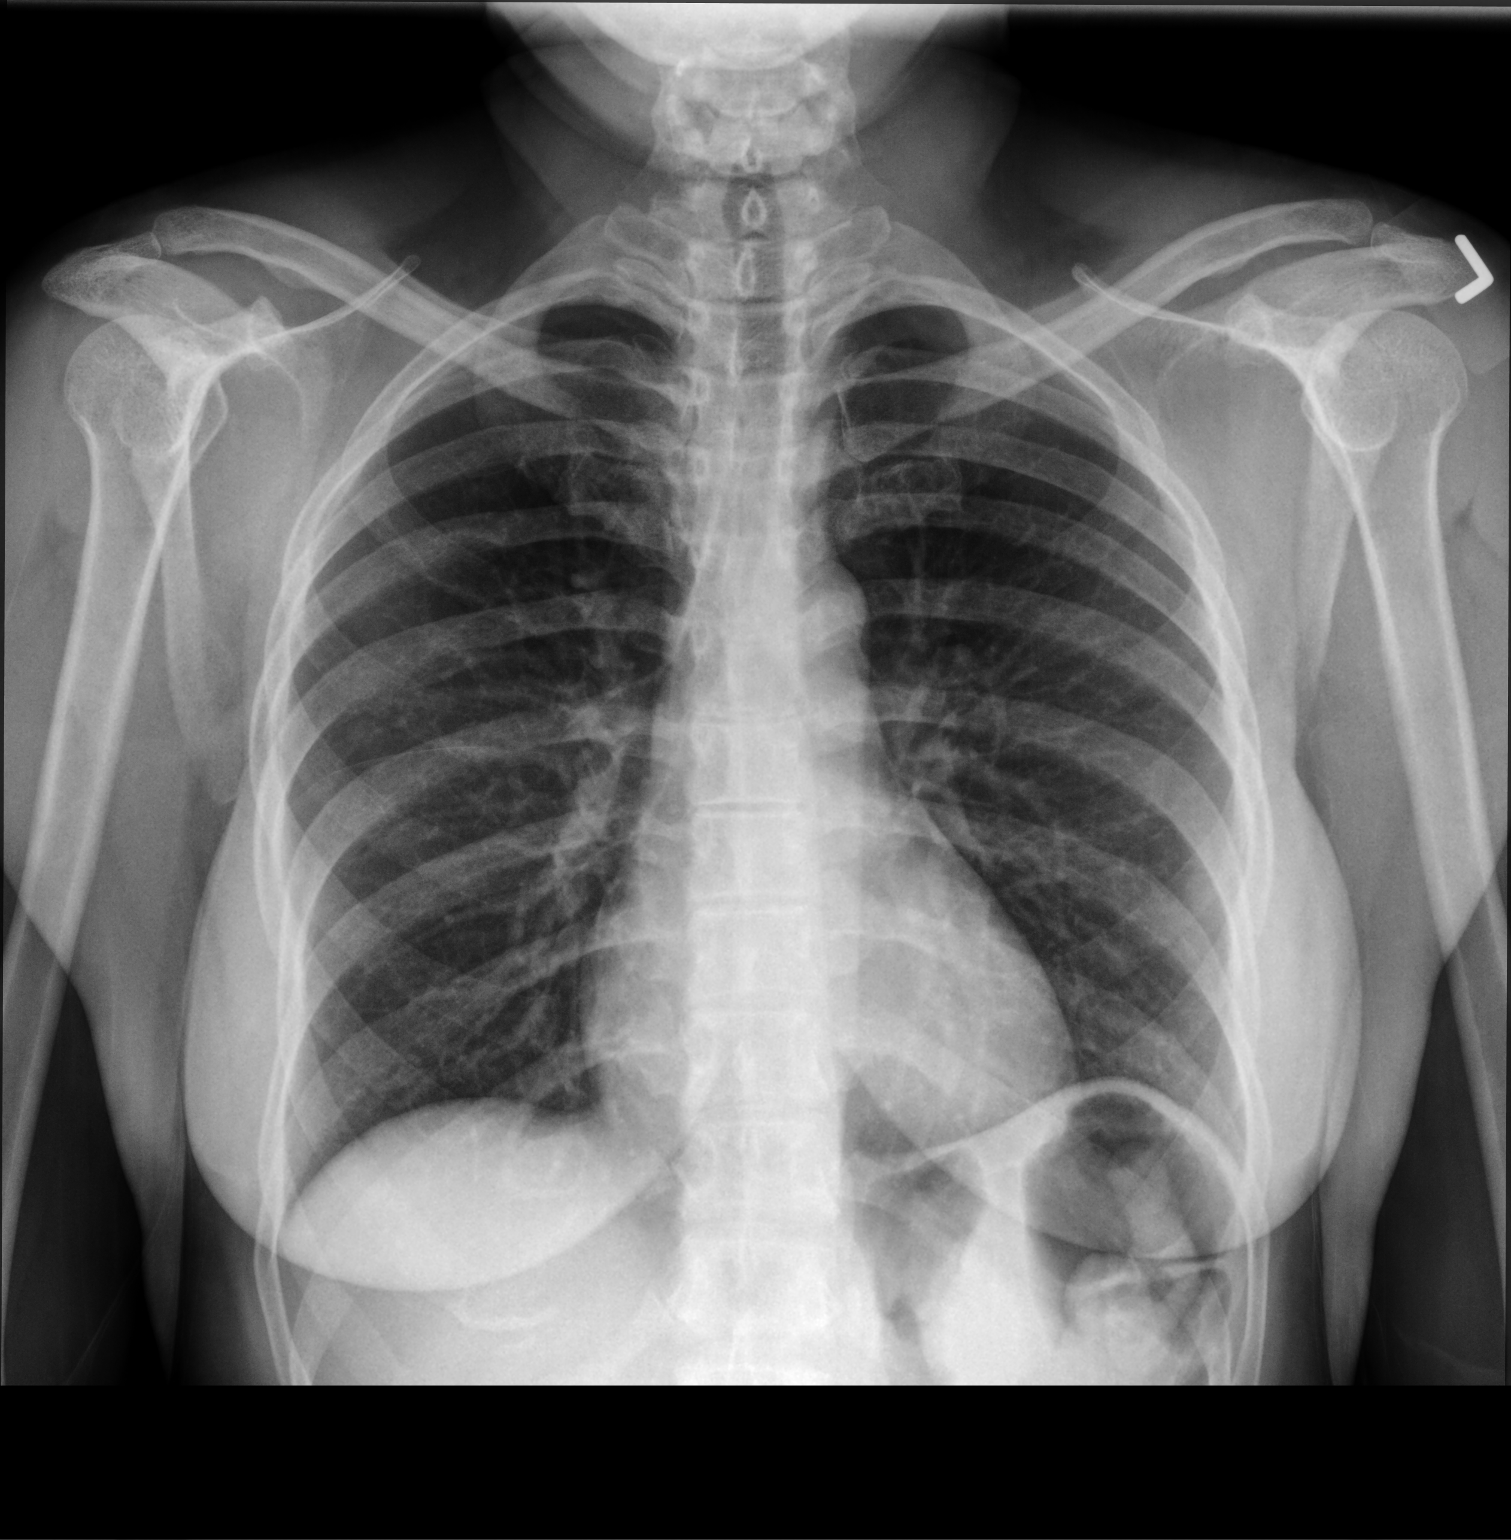

[chest lat]
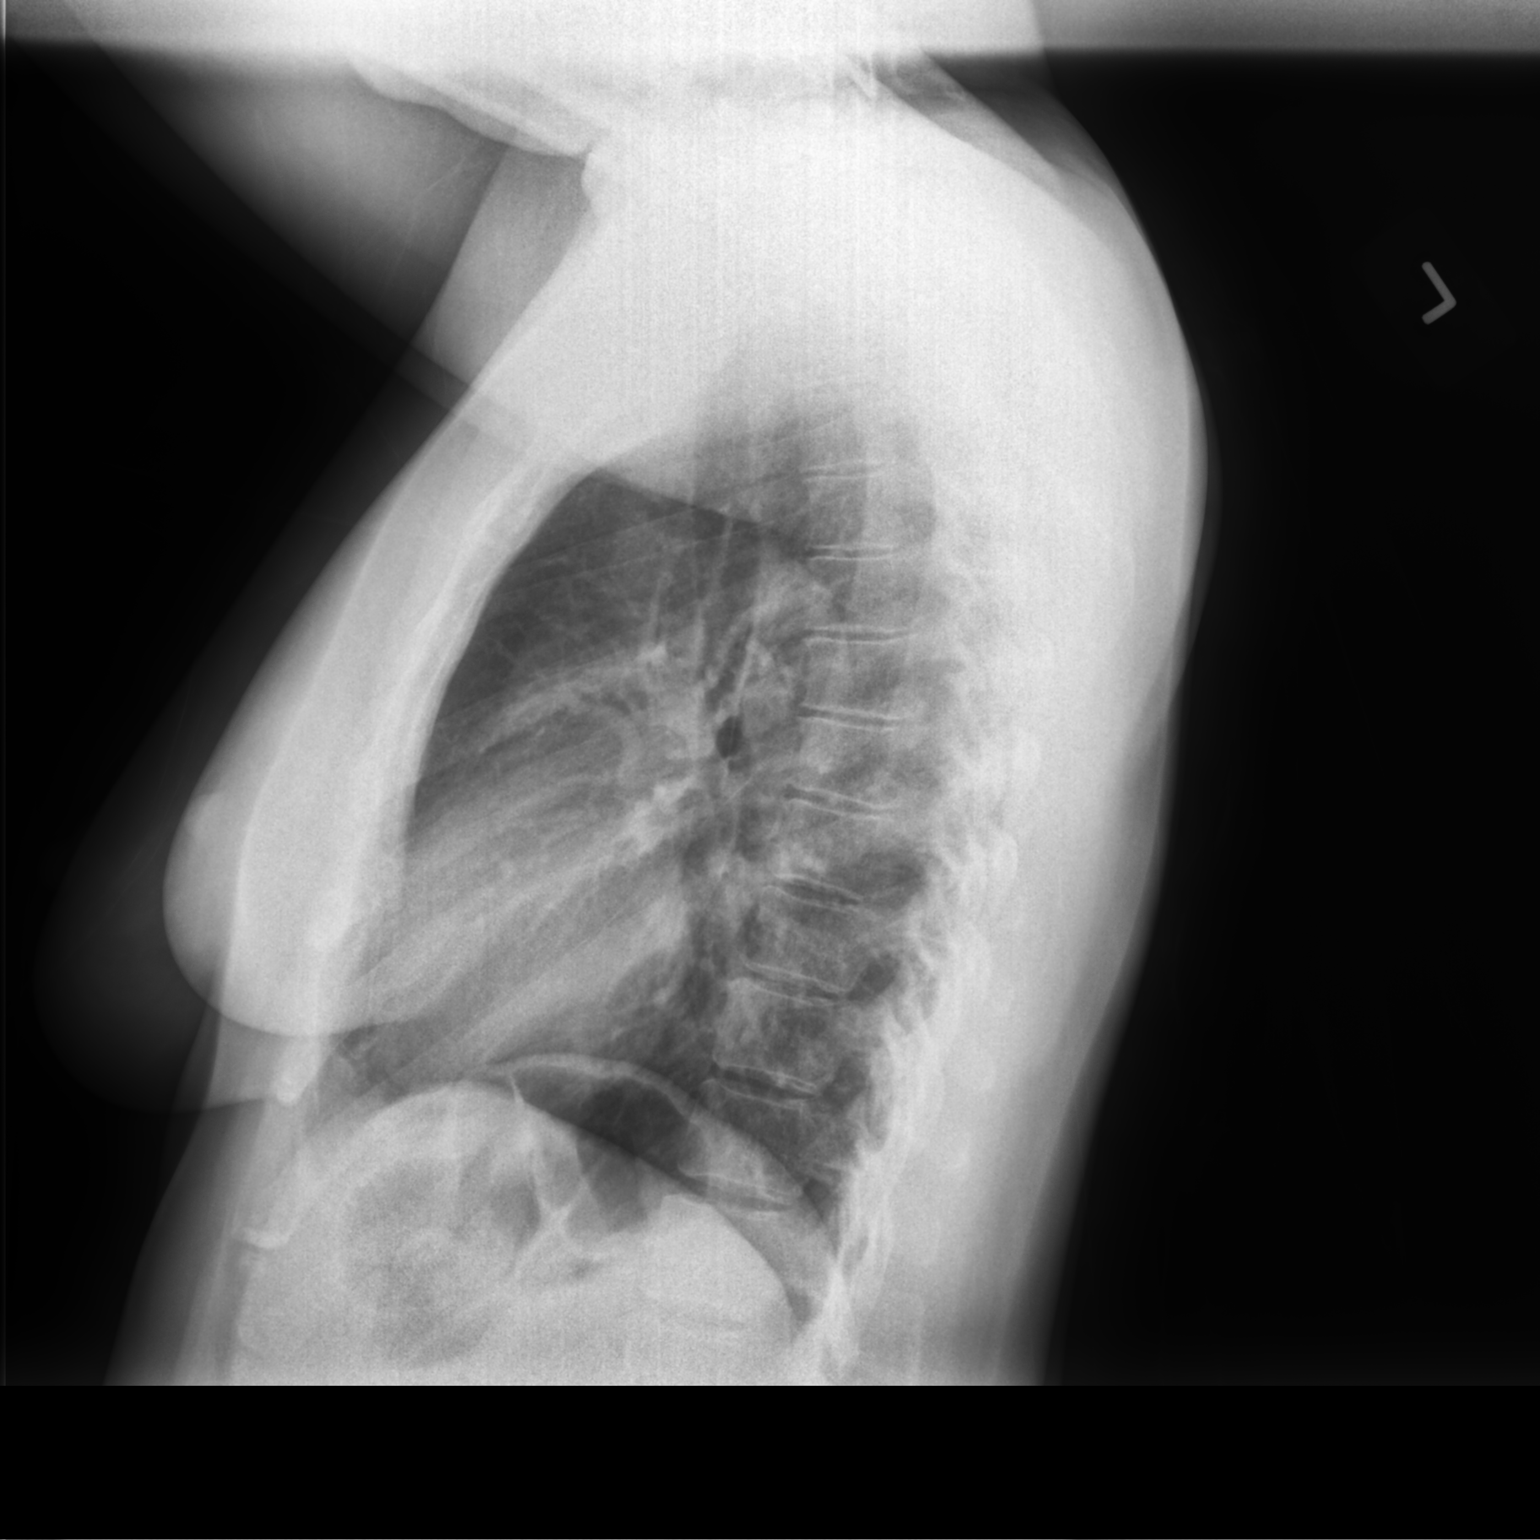

[2 of 2 positions shown; findings below may reference images not displayed]

FINDINGS: The heart size and mediastinal contours are within normal limits.
Both lungs are clear. The visualized skeletal structures are
unremarkable.
IMPRESSION: No active cardiopulmonary disease.

## 2023-05-17 ENCOUNTER — Encounter: Payer: Self-pay | Admitting: Nurse Practitioner

## 2023-05-17 ENCOUNTER — Ambulatory Visit (INDEPENDENT_AMBULATORY_CARE_PROVIDER_SITE_OTHER): Payer: BLUE CROSS/BLUE SHIELD | Admitting: Nurse Practitioner

## 2023-05-17 VITALS — BP 118/80 | HR 68 | Temp 97.7°F | Ht 67.0 in | Wt 163.8 lb

## 2023-05-17 DIAGNOSIS — Z0000 Encounter for general adult medical examination without abnormal findings: Secondary | ICD-10-CM | POA: Diagnosis not present

## 2023-05-17 DIAGNOSIS — R5383 Other fatigue: Secondary | ICD-10-CM

## 2023-05-17 DIAGNOSIS — Z862 Personal history of diseases of the blood and blood-forming organs and certain disorders involving the immune mechanism: Secondary | ICD-10-CM

## 2023-05-17 DIAGNOSIS — E663 Overweight: Secondary | ICD-10-CM | POA: Insufficient documentation

## 2023-05-17 DIAGNOSIS — E538 Deficiency of other specified B group vitamins: Secondary | ICD-10-CM

## 2023-05-17 DIAGNOSIS — Z1211 Encounter for screening for malignant neoplasm of colon: Secondary | ICD-10-CM

## 2023-05-17 NOTE — Assessment & Plan Note (Signed)
History of the same pending B12 level 

## 2023-05-17 NOTE — Assessment & Plan Note (Signed)
Discussed age-appropriate immunizations and screening exams.  Did review patient's personal, surgical, social, family histories.  Patient is up-to-date on all age-appropriate immunizations.  Placed referral to GI for CRC screening.  Patient is followed by GYN for cervical cancer screening.  And mammograms for breast cancer screening.  Patient was given information at discharge about preventative healthcare maintenance with anticipatory guidance.

## 2023-05-17 NOTE — Assessment & Plan Note (Signed)
History of the same.  Pending B12, iron panel and CBC.

## 2023-05-17 NOTE — Patient Instructions (Signed)
Nice to see you today I will be in touch with the labs once I have them Follow up with me in 1 year for your next physical, sooner if you need me 

## 2023-05-17 NOTE — Progress Notes (Signed)
Established Patient Office Visit  Subjective   Patient ID: Brittney Steele, female    DOB: Sep 04, 1976  Age: 47 y.o. MRN: [id number]  Chief Complaint  Patient presents with   Annual Exam      for complete physical and follow up of chronic conditions. Asthma: was being seen by Vilma Meckel and on Advair. State that she has not used the albuterol at all. No longer on Advair     Immunizations: -Tetanus: Completed in 2018 -Influenza: out of season  -Shingles: too young  -Pneumonia: too young    Diet: Fair diet. States that she is not eating well. States that she is doing  3 meals and some snacks. States that she will drink water with apple cider,cinnamon, honey, molasses  Exercise: No regular exercise. Sometimes.   Eye exam: Completes annually. Glasses  Dental exam: Completes semi-annually   Colonoscopy: Ambulatory referral to GI Lung Cancer Screening: N/A  Pap smear: last year with IUD removal  Mammogram: Performed at [facility] up-to-date per patient report  Dexa: Too young  Sleep: 1030 and get up at 6am. States that she feels restored.  Does not snore        Review of Systems  Constitutional:  Negative for chills and fever.  Respiratory:  Negative for shortness of breath.   Cardiovascular:  Negative for chest pain and leg swelling.  Gastrointestinal:  Negative for abdominal pain, blood in stool, constipation, diarrhea, nausea and vomiting.       Bm daily   Genitourinary:  Negative for dysuria and hematuria.  Neurological:  Positive for dizziness. Negative for tingling and headaches.  Psychiatric/Behavioral:  Negative for hallucinations and suicidal ideas.       Objective:     BP 118/80   Pulse 68   Temp 97.7 F (36.5 C) (Temporal)   Ht 5\' 7"  (1.702 m)   Wt 163 lb 12.8 oz (74.3 kg)   LMP 05/03/2023 (Exact Date)   SpO2 99%   BMI 25.65 kg/m  BP Readings from Last 3 Encounters:  05/17/23 118/80  10/04/22 117/86  04/20/22 112/71    Wt Readings from Last 3 Encounters:  05/17/23 163 lb 12.8 oz (74.3 kg)  10/04/22 158 lb 8 oz (71.9 kg)  04/20/22 172 lb (78 kg)      Physical Exam Vitals and nursing note reviewed.  Constitutional:      Appearance: Normal appearance.  HENT:     Right Ear: Tympanic membrane, ear canal and external ear normal.     Left Ear: Tympanic membrane, ear canal and external ear normal.     Mouth/Throat:     Mouth: Mucous membranes are moist.     Pharynx: Oropharynx is clear.  Eyes:     Extraocular Movements: Extraocular movements intact.     Pupils: Pupils are equal, round, and reactive to light.  Cardiovascular:     Rate and Rhythm: Normal rate and regular rhythm.     Pulses: Normal pulses.     Heart sounds: Normal heart sounds.  Pulmonary:     Effort: Pulmonary effort is normal.     Breath sounds: Normal breath sounds.  Abdominal:     General: Bowel sounds are normal. There is no distension.     Palpations: There is no mass.     Tenderness: There is no abdominal tenderness.     Hernia: No hernia is present.  Musculoskeletal:     Right lower leg: No edema.     Left lower  leg: No edema.  Lymphadenopathy:     Cervical: No cervical adenopathy.  Skin:    General: Skin is warm.  Neurological:     General: No focal deficit present.     Mental Status: She is alert.     Deep Tendon Reflexes:     Reflex Scores:      Bicep reflexes are 2+ on the right side and 2+ on the left side.      Patellar reflexes are 2+ on the right side and 2+ on the left side.    Comments: Bilateral upper and lower extremity strength 5/5  Psychiatric:        Mood and Affect: Mood normal.        Behavior: Behavior normal.        Thought Content: Thought content normal.        Judgment: Judgment normal.      No results found for any visits on 05/17/23.    The 10-year ASCVD risk score (Arnett DK, et al., 2019) is: 0.4%    Assessment & Plan:   Problem List Items Addressed This Visit        Other   Preventative health care - Primary    Discussed age-appropriate immunizations and screening exams.  Did review patient's personal, surgical, social, family histories.  Patient is up-to-date on all age-appropriate immunizations.  Placed referral to GI for CRC screening.  Patient is followed by GYN for cervical cancer screening.  And mammograms for breast cancer screening.  Patient was given information at discharge about preventative healthcare maintenance with anticipatory guidance.      Relevant Orders   CBC   Comprehensive metabolic panel   TSH   History of anemia    History of the same.  Pending B12, iron panel and CBC.      Vitamin B12 deficiency    History of the same pending B12 level      Relevant Orders   Vitamin B12   Overweight (BMI 25.0-29.9)    Pending TSH, lipid, A1c.  Patient instructed on exercise 30 minutes a day 5 times a week.  To work up to that      Relevant Orders   Hemoglobin A1c   Lipid panel   Fatigue    In the setting of his anemia.  Check TSH, CBC, B12, iron level      Relevant Orders   IBC + Ferritin   Vitamin B12   VITAMIN D 25 Hydroxy (Vit-D Deficiency, Fractures)   Other Visit Diagnoses     Screening for colon cancer       Relevant Orders   Ambulatory referral to Gastroenterology       No follow-ups on file.    Audria Nine, NP

## 2023-05-17 NOTE — Assessment & Plan Note (Signed)
Pending TSH, lipid, A1c.  Patient instructed on exercise 30 minutes a day 5 times a week.  To work up to that

## 2023-05-17 NOTE — Assessment & Plan Note (Signed)
In the setting of his anemia.  Check TSH, CBC, B12, iron level

## 2023-09-20 ENCOUNTER — Ambulatory Visit: Payer: BC Managed Care – PPO | Admitting: Dermatology

## 2023-10-02 LAB — HM COLONOSCOPY

## 2023-11-06 ENCOUNTER — Encounter: Payer: Self-pay | Admitting: Nurse Practitioner

## 2024-01-10 ENCOUNTER — Encounter: Payer: Self-pay | Admitting: Family Medicine

## 2024-01-10 ENCOUNTER — Ambulatory Visit: Admitting: Family Medicine

## 2024-01-10 ENCOUNTER — Ambulatory Visit: Payer: Self-pay

## 2024-01-10 VITALS — BP 112/80 | HR 84 | Temp 98.2°F | Ht 67.0 in | Wt 163.0 lb

## 2024-01-10 DIAGNOSIS — R42 Dizziness and giddiness: Secondary | ICD-10-CM | POA: Diagnosis not present

## 2024-01-10 DIAGNOSIS — R82998 Other abnormal findings in urine: Secondary | ICD-10-CM | POA: Diagnosis not present

## 2024-01-10 DIAGNOSIS — R5383 Other fatigue: Secondary | ICD-10-CM

## 2024-01-10 DIAGNOSIS — E538 Deficiency of other specified B group vitamins: Secondary | ICD-10-CM | POA: Diagnosis not present

## 2024-01-10 LAB — POCT URINALYSIS DIPSTICK
Bilirubin, UA: NEGATIVE
Blood, UA: NEGATIVE
Glucose, UA: NEGATIVE
Ketones, UA: NEGATIVE
Leukocytes, UA: NEGATIVE
Nitrite, UA: NEGATIVE
Protein, UA: NEGATIVE
Spec Grav, UA: 1.015 (ref 1.010–1.025)
Urobilinogen, UA: 0.2 U/dL
pH, UA: 7 (ref 5.0–8.0)

## 2024-01-10 NOTE — Telephone Encounter (Signed)
 Chief Complaint: Dizziness Symptoms: dark urine Frequency: x 2 days Pertinent Negatives: Patient denies SOB, CP, fever, dysuria, hematuria Disposition: [] ED /[] Urgent Care (no appt availability in office) / [x] Appointment(In office/virtual)/ []  [place] [facility]/ [] Home Care/ [] Refused Recommended Disposition /[] [place] [facility]/ []  Follow-up with PCP Additional Notes: Pt reports she began with dark urine and mild intermittent dizziness yesterday. She notes she has recently increased fluid intake, denies hematuria, dysuria. OV scheduled today. This RN educated pt on home care, new-worsening symptoms, when to call back/seek emergent care. Pt verbalized understanding and agrees to plan.   Copied from CRM [id number]. Topic: Clinical - Red Word Triage >> Jan 10, 2024  2:26 PM Kathryne Eriksson wrote: Red Word that prompted transfer to Nurse Triage: Dizzy / Lightheadedness Reason for Disposition  [1] MODERATE dizziness (e.g., interferes with normal activities) AND [2] has NOT been evaluated by doctor (or NP/PA) for this  (Exception: Dizziness caused by heat exposure, sudden standing, or poor fluid intake.)  Answer Assessment - Initial Assessment Questions 1. DESCRIPTION: "Describe your dizziness."     Woozy 2. LIGHTHEADED: "Do you feel lightheaded?" (e.g., somewhat faint, woozy, weak upon standing)     Yes 3. VERTIGO: "Do you feel like either you or the room is spinning or tilting?" (i.e. vertigo)     None 5. ONSET:  "When did the dizziness begin?"     Yesterday 8. CAUSE: "What do you think is causing the dizziness?"     Unknown 10. OTHER SYMPTOMS: "Do you have any other symptoms?" (e.g., fever, chest pain, vomiting, diarrhea, bleeding)       Brown urine  Protocols used: Dizziness - Lightheadedness-A-AH

## 2024-01-10 NOTE — Telephone Encounter (Signed)
 Noted. I see she was evaluated

## 2024-01-10 NOTE — Progress Notes (Signed)
 I,Jameka J Llittleton, CMA,acting as a Neurosurgeon for [employer], NP.,have documented all relevant documentation on the behalf of Ellender Hose, NP,as directed by  Ellender Hose, NP while in the presence of Ellender Hose, NP.  Subjective:  Patient ID: Brittney Steele , female    DOB: 03-24-1976 , 48 y.o.   MRN: [id number]  Chief Complaint  Patient presents with   Dizziness    HPI  Patient is 48 year old female presents today for evaluation of lightheadedness/ dizziness. Patient reports  that she had an episode of lightheaded/ dizziness  yesterday especially when she stands up. Also she reports that her urine looks more brown than yellow . She denies any other urinary symptoms such as frequency, urgency, dysuria, chills, flank pain etc.  Orthostatic blood pressure in the clinic is negative for orthostatic hypotension. Urinalysis is negative for blood, nitrates or ketones. Advised patient to stay well hydrated by drinking at least 6-8 glasses of water daily, she voiced understanding. Patient has a  diagnosis anemia ,Vitamin B12 deficiency, but she states that she has stopped taking supplements in months. Advised patient to restart taking her  supplement  especially since she reports that she is on a "diet" , as she may not be getting enough nutrients, she voiced understanding and said she will follow up with her PCP,   If symptoms persists.     Past Medical History:  Diagnosis Date   AMA (advanced maternal age) multigravida 35+    Anemia    followed by Dr. Vilma Meckel   Asthma    COVID    oct. 2022, long covid had a cough and DOE, all has resolved now07/02/2022   Fibroid      Family History  Problem Relation Age of Onset   Diabetes Mother    Cancer Maternal Grandmother    Hypertension Maternal Grandmother      Current Outpatient Medications:    acetaminophen (TYLENOL) 325 MG tablet, Take 2 tablets (650 mg total) by mouth every 6 (six) hours as needed. (Patient not taking: Reported  on 01/10/2024), Disp: , Rfl:    albuterol (VENTOLIN HFA) 108 (90 Base) MCG/ACT inhaler, Inhale 2 puffs into the lungs every 6 (six) hours as needed for wheezing or shortness of breath. (Patient not taking: Reported on 10/04/2022), Disp: , Rfl:    Ascorbic Acid (VITAMIN C) 1000 MG tablet, Take 1,000 mg by mouth daily. (Patient not taking: Reported on 05/17/2023), Disp: , Rfl:    cholecalciferol (VITAMIN D3) 25 MCG (1000 UNIT) tablet, Take 1,000 Units by mouth daily. (Patient not taking: Reported on 05/17/2023), Disp: , Rfl:    Clindamycin-Benzoyl Per, Refr, gel, , Disp: , Rfl:    clobetasol (TEMOVATE) 0.05 % external solution, , Disp: , Rfl:    ferrous sulfate 325 (65 FE) MG EC tablet, Take 325 mg by mouth daily with breakfast. Four tablets with one meal a day (Patient not taking: Reported on 05/17/2023), Disp: , Rfl:    fluticasone-salmeterol (ADVAIR) 500-50 MCG/ACT AEPB, Inhale 1 puff into the lungs in the morning and at bedtime. (Patient not taking: Reported on 10/04/2022), Disp: 60 each, Rfl: 2   HYDROcodone-acetaminophen (NORCO/VICODIN) 5-325 MG tablet, , Disp: , Rfl:    ibuprofen (ADVIL) 200 MG tablet, Take 3 tablets (600 mg total) by mouth every 6 (six) hours as needed. (Patient not taking: Reported on 01/10/2024), Disp: , Rfl:    ibuprofen (ADVIL) 600 MG tablet, Take 1 tablet by mouth every 6 (six) hours as needed. (Patient  not taking: Reported on 01/10/2024), Disp: , Rfl:    JUNEL FE 1/20 1-20 MG-MCG tablet, Take 1 tablet by mouth daily. (Patient not taking: Reported on 01/10/2024), Disp: , Rfl:    Multiple Vitamin (MULTIVITAMIN) tablet, Take 1 tablet by mouth daily. (Patient not taking: Reported on 05/17/2023), Disp: , Rfl:    spironolactone (ALDACTONE) 50 MG tablet, , Disp: , Rfl:    tranexamic acid (LYSTEDA) 650 MG TABS tablet, Take 2 tablets (1,300 mg total) by mouth 3 (three) times daily. (Patient not taking: Reported on 01/10/2024), Disp: 18 tablet, Rfl: 0   Allergies  Allergen Reactions   Duramorph  [Morphine] Itching     Review of Systems  Constitutional: Negative.  Negative for fatigue and fever.  HENT: Negative.  Negative for congestion.   Eyes: Negative.   Respiratory: Negative.    Cardiovascular: Negative.   Gastrointestinal: Negative.   Genitourinary: Negative.  Negative for dysuria, flank pain, frequency and urgency.  Musculoskeletal: Negative.   Skin: Negative.   Neurological:  Positive for light-headedness.  Psychiatric/Behavioral: Negative.       Today's Vitals   01/10/24 1608 01/10/24 1612 01/10/24 1613  BP:  110/64 112/80  Pulse:  82 84  Temp: 98.2 F (36.8 C)    TempSrc: Oral    Weight: 163 lb (73.9 kg)    Height: 5\' 7"  (1.702 m)    PainSc: 0-No pain     Body mass index is 25.53 kg/m.  Wt Readings from Last 3 Encounters:  01/10/24 163 lb (73.9 kg)  05/17/23 163 lb 12.8 oz (74.3 kg)  10/04/22 158 lb 8 oz (71.9 kg)    The 10-year ASCVD risk score (Arnett DK, et al., 2019) is: 0.3%   Values used to calculate the score:     Age: 11 years     Sex: Female     Is Non-Hispanic African American: Yes     Diabetic: No     Tobacco smoker: No     Systolic Blood Pressure: 112 mmHg     Is BP treated: No     HDL Cholesterol: 69.1 mg/dL     Total Cholesterol: 165 mg/dL  Objective:  Physical Exam HENT:     Head: Normocephalic.  Cardiovascular:     Rate and Rhythm: Normal rate and regular rhythm.  Pulmonary:     Effort: Pulmonary effort is normal.  Abdominal:     General: Bowel sounds are normal. There is no distension.     Tenderness: There is no right CVA tenderness or left CVA tenderness.  Neurological:     Mental Status: She is alert.         Assessment And Plan:  Dark urine Assessment & Plan: Urine dipstick shows negative for all components, negative for nitrites, leukocytes, red blood cells.    Orders: -     POCT urinalysis dipstick  Vitamin B12 deficiency Assessment & Plan: Advised to restart b12  supplements   Lightheadedness Assessment & Plan: Encouraged adequate hydration and start taking supplements again.She voiced understanding.     Return if symptoms worsen or fail to improve.  Patient was given opportunity to ask questions. Patient verbalized understanding of the plan and was able to repeat key elements of the plan. All questions were answered to their satisfaction.   I, Ellender Hose, NP, have reviewed all documentation for this visit. The documentation on 01/15/2024 for the exam, diagnosis, procedures, and orders are all accurate and complete.   IF YOU HAVE BEEN REFERRED TO  A SPECIALIST, IT MAY TAKE 1-2 WEEKS TO SCHEDULE/PROCESS THE REFERRAL. IF YOU HAVE NOT HEARD FROM US/SPECIALIST IN TWO WEEKS, PLEASE GIVE [place] A CALL AT [phone].

## 2024-01-15 NOTE — Assessment & Plan Note (Signed)
 Urine dipstick shows negative for all components, negative for nitrites, leukocytes, red blood cells.

## 2024-01-15 NOTE — Assessment & Plan Note (Signed)
 Advised to restart b12 supplements

## 2024-01-15 NOTE — Assessment & Plan Note (Signed)
 Encouraged adequate hydration and start taking supplements again.She voiced understanding.

## 2024-01-15 NOTE — Assessment & Plan Note (Signed)
 Marland Kitchen

## 2024-05-01 ENCOUNTER — Ambulatory Visit
Admission: RE | Admit: 2024-05-01 | Discharge: 2024-05-01 | Disposition: A | Discharge status: Home / Self Care | Source: Ambulatory Visit | Attending: Emergency Medicine | Admitting: Emergency Medicine

## 2024-05-01 VITALS — BP 120/82 | HR 75 | Temp 97.6°F | Resp 16

## 2024-05-01 DIAGNOSIS — S20469A Insect bite (nonvenomous) of unspecified back wall of thorax, initial encounter: Secondary | ICD-10-CM

## 2024-05-01 DIAGNOSIS — W57XXXA Bitten or stung by nonvenomous insect and other nonvenomous arthropods, initial encounter: Secondary | ICD-10-CM | POA: Diagnosis not present

## 2024-05-01 MED ORDER — DOXYCYCLINE HYCLATE 100 MG PO CAPS: 100.0000 mg | ORAL_CAPSULE | Freq: Two times a day (BID) | ORAL | 0 refills | Status: AC

## 2024-05-01 NOTE — ED Triage Notes (Signed)
 Pt presents due to having two ticks bite her last night on back and right foot. States the tick on back had been on for longer.

## 2024-05-01 NOTE — ED Provider Notes (Signed)
 [facility]    CSN: [id number] Arrival date & time: 05/01/24  1023      History   Chief Complaint Chief Complaint  Patient presents with   Insect Bite    I found 2 ticks on me late last night. - Entered by patient    HPI Brittney Steele is a 48 y.o. female.   Patient presents to clinic over concern of multiple tick bites.  She had felt something on her back last week, had her husband looked and he told her it was just a mole.  She felt a sensation between her 2nd and 3rd toes yesterday, and the tick had just attached to her.  She was washing and brushing her hair and noticed that the hair was getting caught on the mole on her back, which had never happened before.  Had her young daughter take a look and her daughter noticed that the mole had legs.  Tick was engorged when she removed it.  Unsure how long the tick on her back had been attached, maybe around 1 week.  Has been outside pulling weeds, there are wounds next to her house.  Does have a dog who has flea and tick treatment, has found 6 or 7 ticks on the dog this year.  She did backed up both with the ticks, but left them at home.    The history is provided by the patient and medical records.    Past Medical History:  Diagnosis Date   AMA (advanced maternal age) multigravida 35+    Anemia    followed by Dr. Donnice Beals   Asthma    COVID    oct. 2022, long covid had a cough and DOE, all has resolved now07/02/2022   Fibroid     Patient Active Problem List   Diagnosis Date Noted   Dark urine 01/10/2024   Overweight (BMI 25.0-29.9) 05/17/2023   Fatigue 05/17/2023   Nail problem 10/04/2022   Lightheadedness 10/04/2022   History of anemia 10/04/2022   Vitamin B12 deficiency 10/04/2022   Hair loss 10/04/2022   Abnormal CBC 02/09/2022   Class 1 obesity due to excess calories without serious comorbidity with body mass index (BMI) of 31.0 to 31.9 in adult 02/09/2022   Allergy-induced asthma  10/18/2017   Preventative health care 09/19/2012    Past Surgical History:  Procedure Laterality Date   CESAREAN SECTION N/A    09/21/05, 03/27/08, and last 03/21/2017   CESAREAN SECTION N/A 03/21/2017   Procedure: REPEAT CESAREAN SECTION;  Surgeon: Lenon Oneil BRAVO, MD;  Location: [facility] SU[facility];  Service: Obstetrics;  Laterality: N/A;   DILATATION & CURETTAGE/HYSTEROSCOPY WITH MYOSURE N/A 04/20/2022   Procedure: DILATATION & CURETTAGE/HYSTEROSCOPY WITH MYOSURE;  Surgeon: Diedre Rosaline BRAVO, MD;  Location: [facility] [place];  Service: Gynecology;  Laterality: N/A;   DILATION AND CURETTAGE OF UTERUS     DILATION AND EVACUATION N/A 05/04/2013   Procedure: DILATATION AND EVACUATION;  Surgeon: Donna Just, DO;  Location: [facility];  Service: Gynecology;  Laterality: N/A;   HERNIA REPAIR     INTRAUTERINE DEVICE (IUD) INSERTION N/A 04/20/2022   Procedure: INTRAUTERINE DEVICE (IUD) INSERTION;  Surgeon: Diedre Rosaline BRAVO, MD;  Location: [facility] [place];  Service: Gynecology;  Laterality: N/A;  mirena  iud insertion    OB History     Gravida  4   Para  3   Term  3   Preterm      AB  1  Living  3      SAB  1   IAB      Ectopic      Multiple  0   Live Births  1            Home Medications    Prior to Admission medications   Medication Sig Start Date End Date Taking? Authorizing Provider  doxycycline  (VIBRAMYCIN ) 100 MG capsule Take 1 capsule (100 mg total) by mouth 2 (two) times daily for 7 days. 05/01/24 05/08/24 Yes Alakai Macbride  N, FNP  acetaminophen  (TYLENOL ) 325 MG tablet Take 2 tablets (650 mg total) by mouth every 6 (six) hours as needed. Patient not taking: Reported on 01/10/2024 04/20/22   Diedre Rosaline BRAVO, MD  albuterol  (VENTOLIN  HFA) 108 [phone] Base) MCG/ACT inhaler Inhale 2 puffs into the lungs every 6 (six) hours as needed for wheezing or shortness of breath. Patient not taking: Reported on 10/04/2022    [provider]  Ascorbic Acid (VITAMIN C) 1000 MG tablet Take 1,000 mg by mouth daily. Patient not taking: Reported on 05/17/2023    [provider]  cholecalciferol (VITAMIN D3) 25 MCG (1000 UNIT) tablet Take 1,000 Units by mouth daily. Patient not taking: Reported on 05/17/2023    [provider]  Clindamycin -Benzoyl Per, Refr, gel     [provider]  clobetasol (TEMOVATE) 0.05 % external solution     [provider]  ferrous sulfate 325 (65 FE) MG EC tablet Take 325 mg by mouth daily with breakfast. Four tablets with one meal a day Patient not taking: Reported on 05/17/2023    [provider]  fluticasone -salmeterol (ADVAIR) 500-50 MCG/ACT AEPB Inhale 1 puff into the lungs in the morning and at bedtime. Patient not taking: Reported on 10/04/2022 08/10/21   Hunsucker, Donnice SAUNDERS, MD  HYDROcodone-acetaminophen  (NORCO/VICODIN) 5-325 MG tablet     [provider]  ibuprofen  (ADVIL ) 200 MG tablet Take 3 tablets (600 mg total) by mouth every 6 (six) hours as needed. Patient not taking: Reported on 01/10/2024 04/20/22   Diedre Rosaline BRAVO, MD  ibuprofen  (ADVIL ) 600 MG tablet Take 1 tablet by mouth every 6 (six) hours as needed. Patient not taking: Reported on 01/10/2024    [provider]  JUNEL FE 1/20 1-20 MG-MCG tablet Take 1 tablet by mouth daily. Patient not taking: Reported on 01/10/2024 04/12/23   [provider]  Multiple Vitamin (MULTIVITAMIN) tablet Take 1 tablet by mouth daily. Patient not taking: Reported on 05/17/2023    [provider]  spironolactone (ALDACTONE) 50 MG tablet     [provider]  tranexamic acid  (LYSTEDA ) 650 MG TABS tablet Take 2 tablets (1,300 mg total) by mouth 3 (three) times daily. Patient not taking: Reported on 01/10/2024 04/20/22   Diedre Rosaline BRAVO, MD    Family History Family History  Problem Relation Age of Onset   Diabetes Mother    Cancer Maternal Grandmother    Hypertension Maternal  Grandmother     Social History Social History   Tobacco Use   Smoking status: Never   Smokeless tobacco: Never  Vaping Use   Vaping status: Never Used  Substance Use Topics   Alcohol use: No    Alcohol/week: 0.0 standard drinks of alcohol   Drug use: No     Allergies   Duramorph  [morphine ]   Review of Systems Review of Systems  Per HPI  Physical Exam Triage Vital Signs ED Triage Vitals  Encounter Vitals Group  BP 05/01/24 1034 120/82     Girls Systolic BP Percentile --      Girls Diastolic BP Percentile --      Boys Systolic BP Percentile --      Boys Diastolic BP Percentile --      Pulse Rate 05/01/24 1034 75     Resp 05/01/24 1034 16     Temp 05/01/24 1034 97.6 F (36.4 C)     Temp Source 05/01/24 1034 Oral     SpO2 05/01/24 1034 98 %     Weight --      Height --      Head Circumference --      Peak Flow --      Pain Score 05/01/24 1031 0     Pain Loc --      Pain Education --      Exclude from Growth Chart --    No data found.  Updated Vital Signs BP 120/82 (BP Location: Right Arm)   Pulse 75   Temp 97.6 F (36.4 C) (Oral)   Resp 16   LMP 04/01/2024 (Approximate) Comment: partial hysterectomy  SpO2 98%   Visual Acuity Right Eye Distance:   Left Eye Distance:   Bilateral Distance:    Right Eye Near:   Left Eye Near:    Bilateral Near:     Physical Exam Vitals and nursing note reviewed.  Constitutional:      Appearance: Normal appearance.  HENT:     Head: Normocephalic and atraumatic.     Right Ear: External ear normal.     Left Ear: External ear normal.     Nose: Nose normal.     Mouth/Throat:     Mouth: Mucous membranes are moist.  Cardiovascular:     Rate and Rhythm: Normal rate.  Pulmonary:     Effort: Pulmonary effort is normal. No respiratory distress.  Musculoskeletal:        General: Normal range of motion.  Skin:    General: Skin is warm and dry.     Findings: No rash.  Neurological:     General: No focal deficit  present.     Mental Status: She is alert and oriented to person, place, and time.  Psychiatric:        Mood and Affect: Mood normal.        Behavior: Behavior normal.      UC Treatments / Results  Labs (all labs ordered are listed, but only abnormal results are displayed) Labs Reviewed - No data to display  EKG   Radiology No results found.  Procedures Procedures (including critical care time)  Medications Ordered in UC Medications - No data to display  Initial Impression / Assessment and Plan / UC Course  I have reviewed the triage vital signs and the nursing notes.  Pertinent labs & imaging results that were available during my care of the patient were reviewed by me and considered in my medical decision making (see chart for details).  Vitals and triage reviewed, patient is hemodynamically stable.  Unsure where the tick was attached on her back, without erythema migrans to her back or her foot.  Due to prolonged duration of attachment and patient anxiety, will treat empirically with doxycycline .  Discussed low likelihood of Lyme disease.  Plan of care, follow-up care return precautions given, no questions at this time.     Final Clinical Impressions(s) / UC Diagnoses   Final diagnoses:  Tick bite of back wall of  thorax, unspecified location, initial encounter     Discharge Instructions      Take doxycycline  twice daily with food to help prevent bacterial infection post tick bite.  This medication can make you more prone to sunburn, please wear layers or additional SPF.  After getting outside, please look at where your body takes.  You can do this in the shower and do this for your partner as well.  Return to clinic for any new or urgent symptoms.  Follow-up with your primary care provider for any ongoing symptoms.    ED Prescriptions     Medication Sig Dispense Auth. Provider   doxycycline  (VIBRAMYCIN ) 100 MG capsule Take 1 capsule (100 mg total) by mouth 2  (two) times daily for 7 days. 14 capsule Dreama, Torra Pala  N, FNP      PDMP not reviewed this encounter.   Dreama Linnie SAILOR, FNP 05/01/24 1105

## 2024-05-01 NOTE — Discharge Instructions (Addendum)
 Take doxycycline  twice daily with food to help prevent bacterial infection post tick bite.  This medication can make you more prone to sunburn, please wear layers or additional SPF.  After getting outside, please look at where your body takes.  You can do this in the shower and do this for your partner as well.  Return to clinic for any new or urgent symptoms.  Follow-up with your primary care provider for any ongoing symptoms.

## 2024-05-17 ENCOUNTER — Ambulatory Visit (INDEPENDENT_AMBULATORY_CARE_PROVIDER_SITE_OTHER): Admitting: Nurse Practitioner

## 2024-05-17 ENCOUNTER — Encounter: Payer: Self-pay | Admitting: Nurse Practitioner

## 2024-05-17 VITALS — BP 108/74 | HR 70 | Temp 98.4°F | Ht 61.25 in | Wt 160.0 lb

## 2024-05-17 DIAGNOSIS — Z0000 Encounter for general adult medical examination without abnormal findings: Secondary | ICD-10-CM | POA: Diagnosis not present

## 2024-05-17 DIAGNOSIS — J4520 Mild intermittent asthma, uncomplicated: Secondary | ICD-10-CM

## 2024-05-17 DIAGNOSIS — R202 Paresthesia of skin: Secondary | ICD-10-CM

## 2024-05-17 DIAGNOSIS — Z1159 Encounter for screening for other viral diseases: Secondary | ICD-10-CM | POA: Diagnosis not present

## 2024-05-17 DIAGNOSIS — E559 Vitamin D deficiency, unspecified: Secondary | ICD-10-CM

## 2024-05-17 DIAGNOSIS — E663 Overweight: Secondary | ICD-10-CM

## 2024-05-17 DIAGNOSIS — Z13220 Encounter for screening for lipoid disorders: Secondary | ICD-10-CM

## 2024-05-17 DIAGNOSIS — Z131 Encounter for screening for diabetes mellitus: Secondary | ICD-10-CM | POA: Diagnosis not present

## 2024-05-17 DIAGNOSIS — E538 Deficiency of other specified B group vitamins: Secondary | ICD-10-CM | POA: Diagnosis not present

## 2024-05-17 DIAGNOSIS — Z862 Personal history of diseases of the blood and blood-forming organs and certain disorders involving the immune mechanism: Secondary | ICD-10-CM

## 2024-05-17 LAB — CBC WITH DIFFERENTIAL/PLATELET
Basophils Absolute: 0 K/uL (ref 0.0–0.1)
Basophils Relative: 0.5 % (ref 0.0–3.0)
Eosinophils Absolute: 0 K/uL (ref 0.0–0.7)
Eosinophils Relative: 0.7 % (ref 0.0–5.0)
HCT: 38.2 % (ref 36.0–46.0)
Hemoglobin: 12.5 g/dL (ref 12.0–15.0)
Lymphocytes Relative: 39.9 % (ref 12.0–46.0)
Lymphs Abs: 2.1 K/uL (ref 0.7–4.0)
MCHC: 32.6 g/dL (ref 30.0–36.0)
MCV: 83.9 fl (ref 78.0–100.0)
Monocytes Absolute: 0.4 K/uL (ref 0.1–1.0)
Monocytes Relative: 7.5 % (ref 3.0–12.0)
Neutro Abs: 2.7 K/uL (ref 1.4–7.7)
Neutrophils Relative %: 51.4 % (ref 43.0–77.0)
Platelets: 225 K/uL (ref 150.0–400.0)
RBC: 4.55 Mil/uL (ref 3.87–5.11)
RDW: 13 % (ref 11.5–15.5)
WBC: 5.2 K/uL (ref 4.0–10.5)

## 2024-05-17 LAB — VITAMIN D 25 HYDROXY (VIT D DEFICIENCY, FRACTURES): VITD: 24.07 ng/mL — ABNORMAL LOW (ref 30.00–100.00)

## 2024-05-17 LAB — COMPREHENSIVE METABOLIC PANEL WITH GFR
ALT: 11 U/L (ref 0–35)
AST: 15 U/L (ref 0–37)
Albumin: 4.3 g/dL (ref 3.5–5.2)
Alkaline Phosphatase: 49 U/L (ref 39–117)
BUN: 8 mg/dL (ref 6–23)
CO2: 28 meq/L (ref 19–32)
Calcium: 8.9 mg/dL (ref 8.4–10.5)
Chloride: 104 meq/L (ref 96–112)
Creatinine, Ser: 0.82 mg/dL (ref 0.40–1.20)
GFR: 85.85 mL/min (ref >60.00)
Glucose, Bld: 85 mg/dL (ref 70–99)
Potassium: 3.8 meq/L (ref 3.5–5.1)
Sodium: 142 meq/L (ref 135–145)
Total Bilirubin: 0.6 mg/dL (ref 0.2–1.2)
Total Protein: 6.6 g/dL (ref 6.0–8.3)

## 2024-05-17 LAB — LIPID PANEL
Cholesterol: 175 mg/dL (ref 0–200)
HDL: 57.6 mg/dL (ref >39.00)
LDL Cholesterol: 104 mg/dL — ABNORMAL HIGH (ref 0–99)
NonHDL: 117.73
Total CHOL/HDL Ratio: 3
Triglycerides: 70 mg/dL (ref 0.0–149.0)
VLDL: 14 mg/dL (ref 0.0–40.0)

## 2024-05-17 LAB — VITAMIN B12: Vitamin B-12: 324 pg/mL (ref 211–911)

## 2024-05-17 LAB — IBC + FERRITIN
Ferritin: 26.3 ng/mL (ref 10.0–291.0)
Iron: 89 ug/dL (ref 42–145)
Saturation Ratios: 28 % (ref 20.0–50.0)
TIBC: 317.8 ug/dL (ref 250.0–450.0)
Transferrin: 227 mg/dL (ref 212.0–360.0)

## 2024-05-17 LAB — TSH: TSH: 2.32 u[IU]/mL (ref 0.35–5.50)

## 2024-05-17 LAB — HEMOGLOBIN A1C: Hgb A1c MFr Bld: 5.5 % (ref 4.6–6.5)

## 2024-05-17 NOTE — Assessment & Plan Note (Signed)
 Pending TSH, lipid panel, A1c.  Patient would like to lose weight.  We did discuss GLP-1 receptor agonist but pending labs as she needs weight driven comorbidities to qualify.  Did give patient information about healthy weight wellness clinic in [place]

## 2024-05-17 NOTE — Assessment & Plan Note (Signed)
History of the same pending vitamin D level today

## 2024-05-17 NOTE — Assessment & Plan Note (Addendum)
 History of the same pending B12 level today.  Patient describes and pain in feet paresthesias never both at the same time and never more than 1 extremity involved at a time encouraged patient to journal what this happens i.e. when she is getting out of bed in the morning or siblings for activities.  Also check electrolytes today

## 2024-05-17 NOTE — Progress Notes (Signed)
 Established Patient Office Visit  Subjective   Patient ID: Brittney Steele, female    DOB: 09-29-1976  Age: 48 y.o. MRN: [id number]  Chief Complaint  Patient presents with   Annual Exam    HPI  Asthma: exercised induced and will use albuterol  as needed. She has not been using it.  Patient unsure where her Symbicort inhaler  for complete physical and follow up of chronic conditions.  Immunizations: -Tetanus: Completed in 2018 -Influenza: Out of season -Shingles: Too young -Pneumonia: too young  -COVID: Original series  Diet: Fair diet. She is cleaning up her diet. She will fast on occasion. She will did 3 meals a day. She is focusing on protein and veggies.  Exercise:  She is walking around the neigboorhood or doing 10 labs on her driveway   Eye exam: Completes annually. Readers   Dental exam: Completes semi-annually    Colonoscopy: Completed in 09/12/2023 with polyps.  Recall in Lung Cancer Screening: NA   Pap smear: 08/18/2023 with Michelle marione. Last pap was 2022  Mammogram: 08/18/2023 with Rosaline chapel  DEXA: Too young  Sleep: going to bed aroun 1030 and get up around 7. Feels rested. Does snore       Review of Systems  Constitutional:  Negative for chills and fever.  Respiratory:  Negative for shortness of breath.   Cardiovascular:  Negative for chest pain and leg swelling.  Gastrointestinal:  Negative for abdominal pain, blood in stool, constipation, diarrhea, nausea and vomiting.       Bm daily   Genitourinary:  Negative for dysuria and hematuria.  Neurological:  Positive for tingling. Negative for headaches.  Psychiatric/Behavioral:  Negative for hallucinations and suicidal ideas.       Objective:     BP 108/74   Pulse 70   Temp 98.4 F (36.9 C) (Oral)   Ht 5' 1.25 (1.556 m)   Wt 160 lb (72.6 kg)   LMP 04/01/2024 (Approximate) Comment: partial hysterectomy  SpO2 99%   BMI 29.99 kg/m  BP Readings from Last 3 Encounters:   05/17/24 108/74  05/01/24 120/82  01/10/24 112/80   Wt Readings from Last 3 Encounters:  05/17/24 160 lb (72.6 kg)  01/10/24 163 lb (73.9 kg)  05/17/23 163 lb 12.8 oz (74.3 kg)   SpO2 Readings from Last 3 Encounters:  05/17/24 99%  05/01/24 98%  05/17/23 99%      Physical Exam Vitals and nursing note reviewed.  Constitutional:      Appearance: Normal appearance.  HENT:     Right Ear: Tympanic membrane, ear canal and external ear normal.     Left Ear: Tympanic membrane, ear canal and external ear normal.     Mouth/Throat:     Mouth: Mucous membranes are moist.     Pharynx: Oropharynx is clear.  Eyes:     Extraocular Movements: Extraocular movements intact.     Pupils: Pupils are equal, round, and reactive to light.  Cardiovascular:     Rate and Rhythm: Normal rate and regular rhythm.     Pulses: Normal pulses.     Heart sounds: Normal heart sounds.  Pulmonary:     Effort: Pulmonary effort is normal.     Breath sounds: Normal breath sounds.  Abdominal:     General: Bowel sounds are normal. There is no distension.     Palpations: There is no mass.     Tenderness: There is no abdominal tenderness.     Hernia: No hernia is present.  Musculoskeletal:     Right lower leg: No edema.     Left lower leg: No edema.  Lymphadenopathy:     Cervical: No cervical adenopathy.  Skin:    General: Skin is warm.  Neurological:     General: No focal deficit present.     Mental Status: She is alert.     Deep Tendon Reflexes:     Reflex Scores:      Bicep reflexes are 2+ on the right side and 2+ on the left side.      Patellar reflexes are 2+ on the right side and 2+ on the left side.    Comments: Bilateral upper and lower extremity strength 5/5  Psychiatric:        Mood and Affect: Mood normal.        Behavior: Behavior normal.        Thought Content: Thought content normal.        Judgment: Judgment normal.      No results found for any visits on 05/17/24.    The  10-year ASCVD risk score (Arnett DK, et al., 2019) is: 0.3%    Assessment & Plan:   Problem List Items Addressed This Visit       Respiratory   Allergy-induced asthma   Stable at this juncture.  Patient has an albuterol  inhaler at home but on the way back.  Stable      Relevant Orders   CBC with Differential/Platelet   Comprehensive metabolic panel with GFR     Other   Preventative health care - Primary   Discussed age-appropriate immunizations and screening exams.  Did review patient's personal, surgical, social, family histories.  Patient is up-to-date on all age-appropriate vaccinations she would like.  Patient is up-to-date on CRC screening, breast cancer screening, cervical cancer screening per her report.  Patient was given information at discharge about preventative healthcare maintenance with anticipatory guidance      Relevant Orders   CBC with Differential/Platelet   Comprehensive metabolic panel with GFR   TSH   History of anemia   Pending CBC and iron studies      Relevant Orders   CBC with Differential/Platelet   IBC + Ferritin   Vitamin B12 deficiency   History of the same pending B12 level today.  Patient describes and pain in feet paresthesias never both at the same time and never more than 1 extremity involved at a time encouraged patient to journal what this happens i.e. when she is getting out of bed in the morning or siblings for activities.  Also check electrolytes today      Relevant Orders   Vitamin B12   Overweight (BMI 25.0-29.9)   Pending TSH, lipid panel, A1c.  Patient would like to lose weight.  We did discuss GLP-1 receptor agonist but pending labs as she needs weight driven comorbidities to qualify.  Did give patient information about healthy weight wellness clinic in [place]      Vitamin D  deficiency   History of the same pending vitamin D  level today      Relevant Orders   VITAMIN D  25 Hydroxy (Vit-D Deficiency, Fractures)   Other  Visit Diagnoses       Encounter for hepatitis C screening test for low risk patient       Relevant Orders   Hepatitis C antibody     Paresthesia       Relevant Orders   Vitamin B12     Screening for  diabetes mellitus       Relevant Orders   Hemoglobin A1c     Screening for lipid disorders       Relevant Orders   Lipid panel       Return in about 1 year (around 05/17/2025) for CPE and Labs.    Adina Crandall, NP

## 2024-05-17 NOTE — Patient Instructions (Addendum)
 Nice to see you today I will be in touch with the labs once I have them Follow up with me in 1 year, sooner if you need me  [facility]  Address: [address] [place], [place], [place] [address] Hours:  Closes soon ? 5?PM ? Opens 7?AM Tue Confirmed by this business 7 weeks ago Phone: [phone]

## 2024-05-17 NOTE — Assessment & Plan Note (Signed)
 Stable at this juncture.  Patient has an albuterol  inhaler at home but on the way back.  Stable

## 2024-05-17 NOTE — Assessment & Plan Note (Signed)
 Discussed age-appropriate immunizations and screening exams.  Did review patient's personal, surgical, social, family histories.  Patient is up-to-date on all age-appropriate vaccinations she would like.  Patient is up-to-date on CRC screening, breast cancer screening, cervical cancer screening per her report.  Patient was given information at discharge about preventative healthcare maintenance with anticipatory guidance

## 2024-05-17 NOTE — Assessment & Plan Note (Addendum)
Pending CBC and iron studies.

## 2024-05-18 LAB — HEPATITIS C ANTIBODY: Hepatitis C Ab: NONREACTIVE

## 2024-05-21 ENCOUNTER — Ambulatory Visit: Payer: Self-pay | Admitting: Nurse Practitioner

## 2024-08-28 ENCOUNTER — Encounter (INDEPENDENT_AMBULATORY_CARE_PROVIDER_SITE_OTHER): Payer: Self-pay

## 2024-09-23 ENCOUNTER — Ambulatory Visit: Admitting: Family Medicine

## 2024-09-23 ENCOUNTER — Encounter: Payer: Self-pay | Admitting: Family Medicine

## 2024-09-23 VITALS — BP 112/78 | HR 121 | Temp 101.3°F | Ht 61.5 in | Wt 160.0 lb

## 2024-09-23 DIAGNOSIS — R509 Fever, unspecified: Secondary | ICD-10-CM | POA: Diagnosis not present

## 2024-09-23 DIAGNOSIS — J101 Influenza due to other identified influenza virus with other respiratory manifestations: Secondary | ICD-10-CM | POA: Diagnosis not present

## 2024-09-23 LAB — POCT INFLUENZA A/B
Influenza A, POC: POSITIVE — AB
Influenza B, POC: NEGATIVE

## 2024-09-23 MED ORDER — OSELTAMIVIR PHOSPHATE 75 MG PO CAPS: 75.0000 mg | ORAL_CAPSULE | Freq: Two times a day (BID) | ORAL | 0 refills | Status: AC

## 2024-09-23 NOTE — Patient Instructions (Signed)
 You did test positive for influenza A Treat with tamiflu  antiviral sent to pharmacy Push fluids and plenty of rest Continue Theraflu, vitamin C, may take tylenol  or ibuprofen  as needed for fever and body aches.  Let us  know if not improving with treatment.

## 2024-09-23 NOTE — Progress Notes (Signed)
 Ph: [phone] Fax: [phone]   Patient ID: Brittney Steele, female    DOB: 09-12-1976, 48 y.o.   MRN: [id number]  This visit was conducted in person.  BP 112/78 (BP Location: Left Arm, Cuff Size: Normal)   Pulse (!) 121   Temp (!) 101.3 F (38.5 C) (Oral)   Ht 5' 1.5 (1.562 m)   Wt 160 lb (72.6 kg)   SpO2 98%   BMI 29.74 kg/m    CC: flu like symptoms Subjective:   HPI: [name] is a 48 y.o. 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 mild ST, HA, PNdrainage, mild diarrhea and nausea now better. Sudden onset of symptoms. Did feel better yesterday but had bad night.   No dyspnea, wheezing, chest pain, ear or tooth pain, abd pain.  H/o mild asthma (allergy induced) H/o long COVID 2022.  Non smoker.   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  Was at [facility] this weekend in track meeting - around a lot of people.   Treating with theraflu, OJ     Relevant past medical, surgical, family and social history reviewed and updated as indicated. Interim medical history since our last visit reviewed. Allergies and medications reviewed and updated. Outpatient Medications Prior to Visit  Medication Sig Dispense Refill   albuterol  (VENTOLIN  HFA) 108 (90 Base) MCG/ACT inhaler Inhale 2 puffs into the lungs every 6 (six) hours as needed for wheezing or shortness of breath.     fluticasone -salmeterol (ADVAIR) 500-50 MCG/ACT AEPB Inhale 1 puff into the lungs in the morning and at bedtime. 60 each 2   JUNEL FE 1/20 1-20 MG-MCG tablet Take 1 tablet by mouth daily.     Multiple Vitamin (MULTIVITAMIN) tablet Take 1 tablet by mouth daily.     No facility-administered medications prior to visit.     Per HPI unless specifically indicated in ROS  section below Review of Systems  Objective:  BP 112/78 (BP Location: Left Arm, Cuff Size: Normal)   Pulse (!) 121   Temp (!) 101.3 F (38.5 C) (Oral)   Ht 5' 1.5 (1.562 m)   Wt 160 lb (72.6 kg)   SpO2 98%   BMI 29.74 kg/m   Wt Readings from Last 3 Encounters:  09/23/24 160 lb (72.6 kg)  05/17/24 160 lb (72.6 kg)  01/10/24 163 lb (73.9 kg)      Physical Exam Vitals and nursing note reviewed.  Constitutional:      Appearance: Normal appearance. She is not ill-appearing.  HENT:     Head: Normocephalic and atraumatic.     Right Ear: Tympanic membrane, ear canal and external ear normal. There is no impacted cerumen.     Left Ear: Tympanic membrane, ear canal and external ear normal. There is no impacted cerumen.     Nose:     Right Sinus: No maxillary sinus tenderness or frontal sinus tenderness.     Left Sinus: No maxillary sinus tenderness or frontal sinus tenderness.     Mouth/Throat:     Mouth: Mucous membranes are moist.     Pharynx: Oropharynx is clear. No oropharyngeal exudate or posterior oropharyngeal erythema.  Eyes:     Extraocular Movements: Extraocular movements intact.     Conjunctiva/sclera: Conjunctivae normal.  Pupils: Pupils are equal, round, and reactive to light.  Cardiovascular:     Rate and Rhythm: Regular rhythm. Tachycardia present.     Pulses: Normal pulses.     Heart sounds: Normal heart sounds. No murmur heard. Pulmonary:     Effort: Pulmonary effort is normal. No respiratory distress.     Breath sounds: Normal breath sounds. No wheezing, rhonchi or rales.     Comments: Lungs clear Lymphadenopathy:     Head:     Right side of head: No submental, submandibular, tonsillar, preauricular or posterior auricular adenopathy.     Left side of head: No submental, submandibular, tonsillar, preauricular or posterior auricular adenopathy.     Cervical: No cervical adenopathy.     Right cervical: No superficial cervical adenopathy.    Left cervical: No  superficial cervical adenopathy.     Upper Body:     Right upper body: No supraclavicular adenopathy.     Left upper body: No supraclavicular adenopathy.  Skin:    Findings: No rash.  Neurological:     Mental Status: She is alert.  Psychiatric:        Mood and Affect: Mood normal.        Behavior: Behavior normal.       Results for orders placed or performed in visit on 09/23/24  POCT Influenza A/B   Collection Time: 09/23/24  8:47 AM  Result Value Ref Range   Influenza A, POC Positive (A) Negative   Influenza B, POC Negative Negative    Assessment & Plan:   Problem List Items Addressed This Visit     Influenza A - Primary   Story/exam consistent with influenza, flu swab positive for flu A. Rx tamiflu .  Supportive measures reviewed. Update if new or worsening symptoms.       Relevant Medications   oseltamivir  (TAMIFLU ) 75 MG capsule   Other Visit Diagnoses       Fever, unspecified fever cause       Relevant Orders   POCT Influenza A/B (Completed)        Meds ordered this encounter  Medications   oseltamivir  (TAMIFLU ) 75 MG capsule    Sig: Take 1 capsule (75 mg total) by mouth 2 (two) times daily.    Dispense:  10 capsule    Refill:  0    Orders Placed This Encounter  Procedures   POCT Influenza A/B    Patient Instructions  You did test positive for influenza A Treat with tamiflu  antiviral sent to pharmacy Push fluids and plenty of rest Continue Theraflu, vitamin C, may take tylenol  or ibuprofen  as needed for fever and body aches.  Let us  know if not improving with treatment.  Follow up plan: Return if symptoms worsen or fail to improve.  Anton Blas, MD

## 2024-09-23 NOTE — Assessment & Plan Note (Signed)
 Story/exam consistent with influenza, flu swab positive for flu A. Rx tamiflu .  Supportive measures reviewed. Update if new or worsening symptoms.

## 2024-10-11 ENCOUNTER — Ambulatory Visit: Payer: Self-pay

## 2024-10-11 ENCOUNTER — Ambulatory Visit: Admitting: Internal Medicine

## 2024-10-11 ENCOUNTER — Other Ambulatory Visit: Payer: Self-pay

## 2024-10-11 VITALS — BP 110/72 | HR 75 | Temp 98.1°F | Resp 18 | Ht 61.0 in | Wt 164.4 lb

## 2024-10-11 DIAGNOSIS — J4520 Mild intermittent asthma, uncomplicated: Secondary | ICD-10-CM

## 2024-10-11 DIAGNOSIS — R058 Other specified cough: Secondary | ICD-10-CM

## 2024-10-11 MED ORDER — ALBUTEROL SULFATE (2.5 MG/3ML) 0.083% IN NEBU
2.5000 mg | INHALATION_SOLUTION | Freq: Once | RESPIRATORY_TRACT | Status: AC
  Administered 2024-10-21: 2.5 mg via RESPIRATORY_TRACT

## 2024-10-11 MED ORDER — FLUTICASONE-SALMETEROL 500-50 MCG/ACT IN AEPB: 1.0000 | INHALATION_SPRAY | Freq: Two times a day (BID) | RESPIRATORY_TRACT | 2 refills | Status: AC

## 2024-10-11 MED ORDER — ALBUTEROL SULFATE 0.63 MG/3ML IN NEBU: 1.0000 | INHALATION_SOLUTION | Freq: Four times a day (QID) | RESPIRATORY_TRACT | 12 refills | Status: AC | PRN

## 2024-10-11 MED ORDER — ALBUTEROL SULFATE HFA 108 (90 BASE) MCG/ACT IN AERS: 2.0000 | INHALATION_SPRAY | Freq: Four times a day (QID) | RESPIRATORY_TRACT | 1 refills | Status: AC | PRN

## 2024-10-11 NOTE — Telephone Encounter (Signed)
 Noted

## 2024-10-11 NOTE — Progress Notes (Signed)
 "  Acute Office Visit  Subjective:     Patient ID: Brittney Steele, female    DOB: [birth year], 49 y.o.   MRN: [id number]  Chief Complaint  Patient presents with   Cough    Persistent cough after having flu for 2 weeks    Cough Associated symptoms include wheezing. Pertinent negatives include no chills, fever or shortness of breath.   Patient is in today for ongoing cough after flu x 2 weeks. This is my first time meeting her.   Discussed the use of AI scribe software for clinical note transcription with the patient, who gave verbal consent to proceed.  History of Present Illness Brittney Steele is a 49 year old female with allergy-induced asthma who presents with a persistent cough following a recent flu infection.  She had the flu a couple of weeks ago, which has resolved, but she continues to have a persistent dry cough with a sensation of something stuck in her throat. She denies wheezing, shortness of breath, or sputum production. She has allergy-induced asthma and usually uses Advair and albuterol  inhalers but currently has neither after being weaned off them following a previous COVID-19 infection.   Review of Systems  Constitutional:  Negative for chills and fever.  HENT: Negative.    Respiratory:  Positive for cough and wheezing. Negative for shortness of breath.         Objective:    BP 110/72 (Cuff Size: Large)   Pulse 75   Temp 98.1 F (36.7 C) (Oral)   Resp 18   Ht 5' 1 (1.549 m)   Wt 164 lb 6.4 oz (74.6 kg)   SpO2 99%   BMI 31.06 kg/m    Physical Exam Constitutional:      Appearance: Normal appearance.  HENT:     Head: Normocephalic and atraumatic.  Eyes:     Conjunctiva/sclera: Conjunctivae normal.  Cardiovascular:     Rate and Rhythm: Normal rate and regular rhythm.  Pulmonary:     Effort: Pulmonary effort is normal.     Breath sounds: No wheezing, rhonchi or rales.     Comments: Decreased air movement throughout  Skin:    General:  Skin is warm and dry.  Neurological:     General: No focal deficit present.     Mental Status: She is alert. Mental status is at baseline.  Psychiatric:        Mood and Affect: Mood normal.        Behavior: Behavior normal.     No results found for any visits on 10/11/24.      Assessment & Plan:   Assessment & Plan Post-viral cough syndrome Persistent dry cough post-influenza, likely post-viral cough syndrome. Cough may last 8-12 weeks. - Performed breathing treatment with short-acting bronchodilator which improved symptoms.  - Refilled Advair inhaler for maintenance. - Refilled albuterol  inhaler for as-needed use. Will send in order for home nebulizer and Albuterol  for that as well.   Mild intermittent extrinsic asthma Asthma exacerbated by recent viral infection. No current wheezing or shortness of breath. - Refilled Advair inhaler for daily maintenance. - Refilled albuterol  inhaler for as-needed use. - Educated on rinsing mouth after Advair use to prevent oral thrush.  - albuterol  (VENTOLIN  HFA) 108 (90 Base) MCG/ACT inhaler; Inhale 2 puffs into the lungs every 6 (six) hours as needed for wheezing or shortness of breath.  Dispense: 1 each; Refill: 1 - fluticasone -salmeterol (ADVAIR) 500-50 MCG/ACT AEPB; Inhale 1 puff into the lungs  in the morning and at bedtime.  Dispense: 60 each; Refill: 2 - For home use only DME Other see comment - albuterol  (ACCUNEB ) 0.63 MG/3ML nebulizer solution; Take 3 mLs (0.63 mg total) by nebulization every 6 (six) hours as needed for wheezing.  Dispense: 75 mL; Refill: 12   Return if symptoms worsen or fail to improve.  Brittney Fischer, DO   "

## 2024-10-11 NOTE — Telephone Encounter (Signed)
 FYI Only or Action Required?: FYI only for provider: appointment scheduled on 10/11/24 at alternate office.  Patient was last seen in primary care on 09/23/2024 by Rilla Baller, MD.  Called Nurse Triage reporting Cough.  Symptoms began 2 weeks ago.  Interventions attempted: OTC medications: Theraflu and Rest, hydration, or home remedies.  Symptoms are: unchanged.  Triage Disposition: See Physician Within 24 Hours  Patient/caregiver understands and will follow disposition?: Yes                                  1. ONSET: When did the cough begin?      About 2 weeks ago, after she was diagnosed with flu on 09/23/24 2. SEVERITY: How bad is the cough today?      Severe- patient having frequent coughing spells while on phone with this RN 3. SPUTUM: Describe the color of your sputum (e.g., none, dry cough; clear, white, yellow, green)     Denies 5. DIFFICULTY BREATHING: Are you having difficulty breathing? If Yes, ask: How bad is it? (e.g., mild, moderate, severe)      Denies at this time, states she experienced SOB during a coughing spell last night 6. FEVER: Do you have a fever? If Yes, ask: What is your temperature, how was it measured, and when did it start?     Denies 8. LUNG HISTORY: Do you have any history of lung disease?  (e.g., pulmonary embolus, asthma, emphysema)     Allergy-induced asthma  10. OTHER SYMPTOMS: Do you have any other symptoms? (e.g., runny nose, wheezing, chest pain)     Denies chest pain, denies wheezing, denies additional cold/flu symptoms    This RN advised in-person evaluation today. No availability at PCP office. This RN scheduled same day appointment at alternate office within region.   Copied from CRM [id number]. Topic: Clinical - Red Word Triage >> Oct 11, 2024 11:49 AM Joesph NOVAK wrote: Red Word that prompted transfer to Nurse Triage: sob, increased cough - requesting cough medicine .  Reason for  Disposition  [1] Continuous (nonstop) coughing interferes with work or school AND [2] no improvement using cough treatment per Care Advice  Protocols used: Cough - Acute Non-Productive-A-AH

## 2024-10-21 DIAGNOSIS — J4520 Mild intermittent asthma, uncomplicated: Secondary | ICD-10-CM | POA: Diagnosis not present

## 2025-05-20 ENCOUNTER — Encounter: Admitting: Nurse Practitioner
